# Patient Record
Sex: Female | Born: 1960 | Race: White | Hispanic: No | Marital: Married | Smoking: Never smoker
Health system: Southern US, Community
[De-identification: ages and names within clinical notes are randomized; demographics above are authoritative.]

## PROBLEM LIST (undated history)

## (undated) DIAGNOSIS — E7800 Pure hypercholesterolemia, unspecified: Secondary | ICD-10-CM

## (undated) DIAGNOSIS — K219 Gastro-esophageal reflux disease without esophagitis: Secondary | ICD-10-CM

## (undated) HISTORY — DX: Gastro-esophageal reflux disease without esophagitis: K21.9

## (undated) HISTORY — DX: Pure hypercholesterolemia, unspecified: E78.00

---

## 1997-12-27 ENCOUNTER — Ambulatory Visit (HOSPITAL_COMMUNITY): Admission: RE | Admit: 1997-12-27 | Discharge: 1997-12-27 | Payer: Self-pay | Admitting: Obstetrics and Gynecology

## 1998-06-10 ENCOUNTER — Inpatient Hospital Stay (HOSPITAL_COMMUNITY): Admission: AD | Admit: 1998-06-10 | Discharge: 1998-06-10 | Payer: Self-pay | Admitting: Obstetrics and Gynecology

## 1999-04-23 ENCOUNTER — Other Ambulatory Visit: Admission: RE | Admit: 1999-04-23 | Discharge: 1999-04-23 | Payer: Self-pay | Admitting: Obstetrics and Gynecology

## 1999-06-06 ENCOUNTER — Encounter: Payer: Self-pay | Admitting: Obstetrics and Gynecology

## 1999-06-06 ENCOUNTER — Ambulatory Visit (HOSPITAL_COMMUNITY): Admission: RE | Admit: 1999-06-06 | Discharge: 1999-06-06 | Payer: Self-pay | Admitting: Obstetrics and Gynecology

## 2000-09-30 ENCOUNTER — Other Ambulatory Visit: Admission: RE | Admit: 2000-09-30 | Discharge: 2000-09-30 | Payer: Self-pay | Admitting: Obstetrics and Gynecology

## 2000-12-03 ENCOUNTER — Ambulatory Visit (HOSPITAL_COMMUNITY): Admission: RE | Admit: 2000-12-03 | Discharge: 2000-12-03 | Payer: Self-pay | Admitting: Obstetrics and Gynecology

## 2000-12-03 ENCOUNTER — Encounter: Payer: Self-pay | Admitting: Obstetrics and Gynecology

## 2001-12-04 ENCOUNTER — Encounter: Payer: Self-pay | Admitting: Obstetrics and Gynecology

## 2001-12-04 ENCOUNTER — Ambulatory Visit (HOSPITAL_COMMUNITY): Admission: RE | Admit: 2001-12-04 | Discharge: 2001-12-04 | Payer: Self-pay | Admitting: Obstetrics and Gynecology

## 2002-09-22 ENCOUNTER — Other Ambulatory Visit: Admission: RE | Admit: 2002-09-22 | Discharge: 2002-09-22 | Payer: Self-pay | Admitting: Obstetrics and Gynecology

## 2003-01-20 ENCOUNTER — Ambulatory Visit (HOSPITAL_COMMUNITY): Admission: RE | Admit: 2003-01-20 | Discharge: 2003-01-20 | Payer: Self-pay | Admitting: Obstetrics and Gynecology

## 2003-11-30 ENCOUNTER — Other Ambulatory Visit: Admission: RE | Admit: 2003-11-30 | Discharge: 2003-11-30 | Payer: Self-pay | Admitting: Obstetrics and Gynecology

## 2004-02-14 ENCOUNTER — Ambulatory Visit (HOSPITAL_COMMUNITY): Admission: RE | Admit: 2004-02-14 | Discharge: 2004-02-14 | Payer: Self-pay | Admitting: Obstetrics and Gynecology

## 2005-02-27 ENCOUNTER — Ambulatory Visit (HOSPITAL_COMMUNITY): Admission: RE | Admit: 2005-02-27 | Discharge: 2005-02-27 | Payer: Self-pay | Admitting: Obstetrics and Gynecology

## 2005-04-30 ENCOUNTER — Other Ambulatory Visit: Admission: RE | Admit: 2005-04-30 | Discharge: 2005-04-30 | Payer: Self-pay | Admitting: Obstetrics & Gynecology

## 2005-06-05 ENCOUNTER — Encounter: Payer: Self-pay | Admitting: Obstetrics and Gynecology

## 2005-07-02 ENCOUNTER — Encounter: Admission: RE | Admit: 2005-07-02 | Discharge: 2005-07-02 | Payer: Self-pay | Admitting: Internal Medicine

## 2006-05-05 ENCOUNTER — Ambulatory Visit (HOSPITAL_COMMUNITY): Admission: RE | Admit: 2006-05-05 | Discharge: 2006-05-05 | Payer: Self-pay | Admitting: Obstetrics and Gynecology

## 2006-05-14 ENCOUNTER — Encounter: Admission: RE | Admit: 2006-05-14 | Discharge: 2006-05-14 | Payer: Self-pay | Admitting: Obstetrics and Gynecology

## 2006-05-19 ENCOUNTER — Other Ambulatory Visit: Admission: RE | Admit: 2006-05-19 | Discharge: 2006-05-19 | Payer: Self-pay | Admitting: Obstetrics and Gynecology

## 2007-05-29 ENCOUNTER — Ambulatory Visit (HOSPITAL_COMMUNITY): Admission: RE | Admit: 2007-05-29 | Discharge: 2007-05-29 | Payer: Self-pay | Admitting: Obstetrics and Gynecology

## 2007-08-19 ENCOUNTER — Other Ambulatory Visit: Admission: RE | Admit: 2007-08-19 | Discharge: 2007-08-19 | Payer: Self-pay | Admitting: Obstetrics and Gynecology

## 2008-06-14 ENCOUNTER — Ambulatory Visit (HOSPITAL_COMMUNITY): Admission: RE | Admit: 2008-06-14 | Discharge: 2008-06-14 | Payer: Self-pay | Admitting: Obstetrics and Gynecology

## 2009-07-13 ENCOUNTER — Ambulatory Visit (HOSPITAL_COMMUNITY): Admission: RE | Admit: 2009-07-13 | Discharge: 2009-07-13 | Payer: Self-pay | Admitting: Obstetrics and Gynecology

## 2010-07-04 ENCOUNTER — Other Ambulatory Visit: Payer: Self-pay | Admitting: Obstetrics and Gynecology

## 2010-07-04 DIAGNOSIS — Z1231 Encounter for screening mammogram for malignant neoplasm of breast: Secondary | ICD-10-CM

## 2010-07-17 ENCOUNTER — Ambulatory Visit (HOSPITAL_COMMUNITY)
Admission: RE | Admit: 2010-07-17 | Discharge: 2010-07-17 | Disposition: A | Discharge status: Home / Self Care | Payer: Managed Care, Other (non HMO) | Source: Ambulatory Visit | Attending: Obstetrics and Gynecology | Admitting: Obstetrics and Gynecology

## 2010-07-17 DIAGNOSIS — Z1231 Encounter for screening mammogram for malignant neoplasm of breast: Secondary | ICD-10-CM | POA: Insufficient documentation

## 2011-07-18 ENCOUNTER — Other Ambulatory Visit: Payer: Self-pay | Admitting: Obstetrics and Gynecology

## 2011-07-18 DIAGNOSIS — Z1231 Encounter for screening mammogram for malignant neoplasm of breast: Secondary | ICD-10-CM

## 2011-08-09 ENCOUNTER — Ambulatory Visit (HOSPITAL_COMMUNITY)
Admission: RE | Admit: 2011-08-09 | Discharge: 2011-08-09 | Disposition: A | Discharge status: Home / Self Care | Payer: Managed Care, Other (non HMO) | Source: Ambulatory Visit | Attending: Obstetrics and Gynecology | Admitting: Obstetrics and Gynecology

## 2011-08-09 DIAGNOSIS — Z1231 Encounter for screening mammogram for malignant neoplasm of breast: Secondary | ICD-10-CM | POA: Insufficient documentation

## 2012-08-12 ENCOUNTER — Other Ambulatory Visit: Payer: Self-pay | Admitting: Obstetrics and Gynecology

## 2012-08-12 DIAGNOSIS — Z1231 Encounter for screening mammogram for malignant neoplasm of breast: Secondary | ICD-10-CM

## 2012-08-24 ENCOUNTER — Ambulatory Visit (HOSPITAL_COMMUNITY)
Admission: RE | Admit: 2012-08-24 | Discharge: 2012-08-24 | Disposition: A | Discharge status: Home / Self Care | Payer: Managed Care, Other (non HMO) | Source: Ambulatory Visit | Attending: Obstetrics and Gynecology | Admitting: Obstetrics and Gynecology

## 2012-08-24 DIAGNOSIS — Z1231 Encounter for screening mammogram for malignant neoplasm of breast: Secondary | ICD-10-CM | POA: Insufficient documentation

## 2013-06-22 ENCOUNTER — Encounter: Payer: Self-pay | Admitting: Certified Nurse Midwife

## 2013-07-22 ENCOUNTER — Ambulatory Visit: Payer: Self-pay | Admitting: Certified Nurse Midwife

## 2013-07-26 ENCOUNTER — Other Ambulatory Visit: Payer: Self-pay | Admitting: Certified Nurse Midwife

## 2013-07-26 DIAGNOSIS — Z1231 Encounter for screening mammogram for malignant neoplasm of breast: Secondary | ICD-10-CM

## 2013-08-03 ENCOUNTER — Ambulatory Visit: Payer: Self-pay | Admitting: Certified Nurse Midwife

## 2013-08-04 ENCOUNTER — Encounter: Payer: Self-pay | Admitting: Certified Nurse Midwife

## 2013-08-04 ENCOUNTER — Ambulatory Visit (INDEPENDENT_AMBULATORY_CARE_PROVIDER_SITE_OTHER): Payer: Managed Care, Other (non HMO) | Admitting: Certified Nurse Midwife

## 2013-08-04 VITALS — BP 116/68 | HR 68 | Resp 16 | Ht 65.75 in | Wt 144.0 lb

## 2013-08-04 DIAGNOSIS — Z01419 Encounter for gynecological examination (general) (routine) without abnormal findings: Secondary | ICD-10-CM

## 2013-08-04 NOTE — Patient Instructions (Signed)

## 2013-08-04 NOTE — Progress Notes (Signed)
53 y.o. [id number] Married Caucasian Fe here for annual exam.  Menopausal no HRT. Occasional hot flashes over the past 6 months, now using Omega 3 with good results. No insomnia issues. Sees PCP periodic. Occasional vaginal dryness uses OTC products without problems. [place] ? Medication use for anxiety. Patient has never taken anything before and has flew before. No other health issues today.  Patient's last menstrual period was 03/07/2012.          Sexually active: Yes.    The current method of family planning is vasectomy.    Exercising: Yes.    walking Smoker:  no  Health Maintenance: Pap:  02-17-12 neg HPV HR neg MMG: 08-24-12 density category b, bi-rads category 1: neg Colonoscopy:  2011 f/u 5 yrs BMD:   none TDaP:  2009 Labs: none Self breast exam: done occ   reports that she has never smoked. She does not have any smokeless tobacco history on file. She reports that she drinks about 4.2 ounces of alcohol per week. She reports that she does not use illicit drugs.  History reviewed. No pertinent past medical history.  Past Surgical History  Procedure Laterality Date  . Cesarean section      No current outpatient prescriptions on file.   No current facility-administered medications for this visit.    Family History  Problem Relation Age of Onset  . Stroke Mother     mini  . Diabetes Brother   . Breast cancer Maternal Aunt   . Cancer Paternal Grandmother     malignant tumor on kidney    ROS:  Pertinent items are noted in HPI.  Otherwise, a comprehensive ROS was negative.  Exam:   BP 116/68  Pulse 68  Resp 16  Ht 5' 5.75" (1.67 m)  Wt 144 lb (65.318 kg)  BMI 23.42 kg/m2  LMP 03/07/2012 Height: 5' 5.75" (167 cm)  Ht Readings from Last 3 Encounters:  08/04/13 5' 5.75" (1.67 m)    General appearance: alert, cooperative and appears stated age Head: Normocephalic, without obvious abnormality, atraumatic Neck: no adenopathy, supple,  symmetrical, trachea midline and thyroid normal to inspection and palpation and non-palpable Lungs: clear to auscultation bilaterally Breasts: normal appearance, no masses or tenderness, No nipple retraction or dimpling, No nipple discharge or bleeding, No axillary or supraclavicular adenopathy Heart: regular rate and rhythm Abdomen: soft, non-tender; no masses,  no organomegaly Extremities: extremities normal, atraumatic, no cyanosis or edema Skin: Skin color, texture, turgor normal. No rashes or lesions Lymph nodes: Cervical, supraclavicular, and axillary nodes normal. No abnormal inguinal nodes palpated Neurologic: Grossly normal   Pelvic: External genitalia:  no lesions              Urethra:  normal appearing urethra with no masses, tenderness or lesions              Bartholin's and Skene's: normal                 Vagina: normal appearing vagina with normal color and discharge, no lesions              Cervix: normal, non tender              Pap taken: No. Bimanual Exam:  Uterus:  normal size, contour, position, consistency, mobility, non-tender and anteverted              Adnexa: normal adnexa and no mass, fullness, tenderness  Rectovaginal: Confirms               Anus:  normal sphincter tone, no lesions  A:  Well Woman with normal exam  Menopausal no HRT using OTC Omega 3 with good results  Flying anxiety  P:   Reviewed health and wellness pertinent to exam  Discussed importance of advising if vaginal bleeding. Discussed Rx medication, but since she has never taken before, may not feel Ok with results. Encouraged patient to try Dramamine OTC sublingal at home to see if she has good results with and can use for flying. If not will advise.  Pap smear not taken today  counseled on breast self exam, mammography screening, adequate intake of calcium and vitamin D, diet and exercise  return annually or prn  An After Visit Summary was printed and given to the  patient.

## 2013-08-09 NOTE — Progress Notes (Signed)
Reviewed personally.  M. Suzanne Jakeb Lamping, MD.  

## 2013-08-30 ENCOUNTER — Ambulatory Visit (HOSPITAL_COMMUNITY)
Admission: RE | Admit: 2013-08-30 | Discharge: 2013-08-30 | Disposition: A | Discharge status: Home / Self Care | Payer: Managed Care, Other (non HMO) | Source: Ambulatory Visit | Attending: Certified Nurse Midwife | Admitting: Certified Nurse Midwife

## 2013-08-30 DIAGNOSIS — Z1231 Encounter for screening mammogram for malignant neoplasm of breast: Secondary | ICD-10-CM | POA: Insufficient documentation

## 2013-12-06 ENCOUNTER — Encounter: Payer: Self-pay | Admitting: Certified Nurse Midwife

## 2014-08-10 ENCOUNTER — Other Ambulatory Visit: Payer: Self-pay | Admitting: Certified Nurse Midwife

## 2014-08-10 DIAGNOSIS — Z1231 Encounter for screening mammogram for malignant neoplasm of breast: Secondary | ICD-10-CM

## 2014-09-07 ENCOUNTER — Ambulatory Visit (HOSPITAL_COMMUNITY)
Admission: RE | Admit: 2014-09-07 | Discharge: 2014-09-07 | Disposition: A | Discharge status: Home / Self Care | Payer: Managed Care, Other (non HMO) | Source: Ambulatory Visit | Attending: Certified Nurse Midwife | Admitting: Certified Nurse Midwife

## 2014-09-07 DIAGNOSIS — Z1231 Encounter for screening mammogram for malignant neoplasm of breast: Secondary | ICD-10-CM | POA: Insufficient documentation

## 2014-09-08 ENCOUNTER — Other Ambulatory Visit: Payer: Self-pay | Admitting: Certified Nurse Midwife

## 2014-09-08 DIAGNOSIS — R928 Other abnormal and inconclusive findings on diagnostic imaging of breast: Secondary | ICD-10-CM

## 2014-09-13 ENCOUNTER — Ambulatory Visit
Admission: RE | Admit: 2014-09-13 | Discharge: 2014-09-13 | Disposition: A | Discharge status: Home / Self Care | Payer: Managed Care, Other (non HMO) | Source: Ambulatory Visit | Attending: Certified Nurse Midwife | Admitting: Certified Nurse Midwife

## 2014-09-13 DIAGNOSIS — R928 Other abnormal and inconclusive findings on diagnostic imaging of breast: Secondary | ICD-10-CM

## 2014-09-14 ENCOUNTER — Encounter: Payer: Self-pay | Admitting: Obstetrics and Gynecology

## 2014-09-14 ENCOUNTER — Ambulatory Visit (INDEPENDENT_AMBULATORY_CARE_PROVIDER_SITE_OTHER): Payer: Managed Care, Other (non HMO) | Admitting: Obstetrics and Gynecology

## 2014-09-14 VITALS — BP 110/78 | HR 64 | Resp 14 | Ht 65.75 in | Wt 148.0 lb

## 2014-09-14 DIAGNOSIS — Z01419 Encounter for gynecological examination (general) (routine) without abnormal findings: Secondary | ICD-10-CM

## 2014-09-14 NOTE — Progress Notes (Signed)
Patient ID: Paige Mann, female   DOB: [birth year], 54 y.o.   MRN: [id number] 54 y.o. [id number] MarriedCaucasianF here for annual exam.  Postmenopausal, no vaginal bleeding. Sexually active, no pain, uses a lubricant. She has had nosebleeds intermittently in the last several months. She will f/u with her primary  Patient's last menstrual period was 03/07/2012.          Sexually active: Yes.    The current method of family planning is post menopausal status.    Exercising: Yes.    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 She has never used smokeless tobacco. She reports that she drinks about 2.4 oz of alcohol per week. She reports that she does not use illicit drugs.  History reviewed. No pertinent past medical history.  Past Surgical History  Procedure Laterality Date  . Cesarean section      No current outpatient prescriptions on file.   No current facility-administered medications for this visit.    Family History  Problem Relation Age of Onset  . Stroke Mother     mini  . Hypertension Mother   . Diabetes Brother   . Breast cancer Maternal Aunt   . Hypertension Father   . Cancer Paternal Grandfather     kidney tumor   SH: daughter is 79, grandchildren are 4 and 1 month, live locally.   ROS:  Pertinent items are noted in HPI.  Otherwise, a comprehensive ROS was negative.  Exam:   BP 110/78 mmHg  Pulse 64  Resp 14  Ht 5' 5.75" (1.67 m)  Wt 148 lb (67.132 kg)  BMI 24.07 kg/m2  LMP 03/07/2012  Weight change: @ Height:   Height: 5' 5.75" (167 cm)  Ht Readings from Last 3 Encounters:  09/14/14 5'  5.75" (1.67 m)  08/04/13 5' 5.75" (1.67 m)    General appearance: alert, cooperative and appears stated age Head: Normocephalic, without obvious abnormality, atraumatic Neck: no adenopathy, supple, symmetrical, trachea midline and thyroid normal to inspection and palpation Lungs: clear to auscultation bilaterally Breasts: normal appearance, no masses or tenderness Heart: regular rate and rhythm Abdomen: soft, non-tender; bowel sounds normal; no masses,  no organomegaly Extremities: extremities normal, atraumatic, no cyanosis or edema Skin: Skin color, texture, turgor normal. No rashes or lesions Lymph nodes: Cervical, supraclavicular, and axillary nodes normal. No abnormal inguinal nodes palpated Neurologic: Grossly normal   Pelvic: External genitalia:  no lesions              Urethra:  normal appearing urethra with no masses, tenderness or lesions              Bartholins and Skenes: normal                 Vagina: atrophic vaginal mucosa              Cervix: no lesions              Pap taken: No. Bimanual Exam:  Uterus:  normal size, contour, position, consistency, mobility, non-tender and anteverted  Adnexa: normal adnexa and no mass, fullness, tenderness               Rectovaginal: Confirms               Anus:  normal sphincter tone, no lesions  Chaperone was present for exam.  A:  Well Woman with normal exam  P:   Pap not due  Discussed calcium and vit D  Discussed breast self exam  Mammogram just done  Colonoscopy due, she will set it up  Labs with her primary

## 2014-09-14 NOTE — Patient Instructions (Signed)

## 2015-02-28 ENCOUNTER — Other Ambulatory Visit: Payer: Self-pay | Admitting: Certified Nurse Midwife

## 2015-02-28 DIAGNOSIS — R921 Mammographic calcification found on diagnostic imaging of breast: Secondary | ICD-10-CM

## 2015-03-16 ENCOUNTER — Ambulatory Visit
Admission: RE | Admit: 2015-03-16 | Discharge: 2015-03-16 | Disposition: A | Discharge status: Home / Self Care | Payer: Managed Care, Other (non HMO) | Source: Ambulatory Visit | Attending: Certified Nurse Midwife | Admitting: Certified Nurse Midwife

## 2015-03-16 DIAGNOSIS — R921 Mammographic calcification found on diagnostic imaging of breast: Secondary | ICD-10-CM

## 2015-09-22 ENCOUNTER — Other Ambulatory Visit: Payer: Self-pay | Admitting: Certified Nurse Midwife

## 2015-09-22 DIAGNOSIS — R921 Mammographic calcification found on diagnostic imaging of breast: Secondary | ICD-10-CM

## 2015-09-28 ENCOUNTER — Ambulatory Visit
Admission: RE | Admit: 2015-09-28 | Discharge: 2015-09-28 | Disposition: A | Discharge status: Home / Self Care | Payer: Managed Care, Other (non HMO) | Source: Ambulatory Visit | Attending: Certified Nurse Midwife | Admitting: Certified Nurse Midwife

## 2015-09-28 DIAGNOSIS — R921 Mammographic calcification found on diagnostic imaging of breast: Secondary | ICD-10-CM

## 2016-02-15 ENCOUNTER — Encounter: Payer: Self-pay | Admitting: Obstetrics and Gynecology

## 2016-02-15 ENCOUNTER — Ambulatory Visit (INDEPENDENT_AMBULATORY_CARE_PROVIDER_SITE_OTHER): Payer: Managed Care, Other (non HMO) | Admitting: Obstetrics and Gynecology

## 2016-02-15 VITALS — BP 110/70 | HR 80 | Resp 15 | Ht 65.5 in | Wt 154.0 lb

## 2016-02-15 DIAGNOSIS — R040 Epistaxis: Secondary | ICD-10-CM | POA: Diagnosis not present

## 2016-02-15 DIAGNOSIS — N952 Postmenopausal atrophic vaginitis: Secondary | ICD-10-CM | POA: Diagnosis not present

## 2016-02-15 DIAGNOSIS — Z01419 Encounter for gynecological examination (general) (routine) without abnormal findings: Secondary | ICD-10-CM

## 2016-02-15 DIAGNOSIS — Z124 Encounter for screening for malignant neoplasm of cervix: Secondary | ICD-10-CM

## 2016-02-15 NOTE — Patient Instructions (Signed)

## 2016-02-15 NOTE — Progress Notes (Signed)
56 y.o. [id number] MarriedCaucasianF here for annual exam.   She c/o intermittent nose bleeds, primary MD is aware. It is year round and just on the left side. Several times a week, occurs most times when she blows her nose. Not a lot.  No vaginal bleeding. Sexually active, no pain.     Patient's last menstrual period was 03/07/2012.          Sexually active: Yes.    The current method of family planning is vasectomy.    Exercising: Yes.    running Smoker:  no  Health Maintenance: Pap:  02-17-12 WNL NEG HR HPV  History of abnormal Pap:  no MMG:  09-28-15 table probably benign calcifications within the upper-outer quadrant of the LEFT breast, perhaps slightly coarser compared to the earliest exam suggesting benignity. Recommend additional follow-up diagnostic mammogram in 12 months to ensure 2 year stability.   Colonoscopy: 10/11  Polyps, overdue for colonoscopy BMD:   Never TDaP:  08-05-07 Gardasil: N/A   reports that she has never smoked. She has never used smokeless tobacco. She reports that she drinks about 4.2 oz of alcohol per week . She reports that she does not use drugs. 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  Daughter and 2 grandchildren are local  No past medical history on file.  Past Surgical History:  Procedure Laterality Date  . CESAREAN SECTION      No current outpatient prescriptions on file.   No current facility-administered medications for this visit.     Family History  Problem Relation Age of Onset  . Stroke Mother     mini  . Hypertension Mother   . Other Mother     Wegners Granulomatosis   . Diabetes Brother   . Breast cancer Maternal Aunt   . Hypertension Father   . Cancer Paternal Grandfather     kidney tumor    Review of Systems  Constitutional: Negative.   HENT: Positive for nosebleeds.   Eyes: Negative.   Respiratory: Negative.   Cardiovascular: Negative.   Gastrointestinal: Negative.   Endocrine: Negative.   Genitourinary: Negative.         Loss of sexual interest   Musculoskeletal: Negative.   Skin: Negative.   Allergic/Immunologic: Negative.   Neurological: Negative.   Psychiatric/Behavioral: Negative.     Exam:   BP 110/70 (BP Location: Right Arm, Patient Position: Sitting, Cuff Size: Normal)   Pulse 80   Resp 15   Ht 5' 5.5" (1.664 m)   Wt 154 lb (69.9 kg)   LMP 03/07/2012   BMI 25.24 kg/m   Weight change: @WEIGHTCHANGE @ Height:   Height: 5' 5.5" (166.4 cm)  Ht Readings from Last 3 Encounters:  02/15/16 5' 5.5" (1.664 m)  09/14/14 5' 5.75" (1.67 m)  08/04/13 5' 5.75" (1.67 m)    General appearance: alert, cooperative and appears stated age Head: Normocephalic, without obvious abnormality, atraumatic Neck: no adenopathy, supple, symmetrical, trachea midline and thyroid normal to inspection and palpation Lungs: clear to auscultation bilaterally Cardiovascular: regular rate and rhythm Breasts: normal appearance, no masses or tenderness Heart: regular rate and rhythm Abdomen: soft, non-tender; bowel sounds normal; no masses,  no organomegaly Extremities: extremities normal, atraumatic, no cyanosis or edema Skin: Skin color, texture, turgor normal. No rashes or lesions Lymph nodes: Cervical, supraclavicular, and axillary nodes normal. No abnormal inguinal nodes palpated Neurologic: Grossly normal   Pelvic: External genitalia:  no lesions              Urethra:  normal appearing urethra with no masses, tenderness or lesions              Bartholins and Skenes: normal                 Vagina: very atrophic vagina, superiorly appears friable              Cervix: no lesions, stenotic, friable with pap               Bimanual Exam:  Uterus:  normal size, contour, position, consistency, mobility, non-tender and anteverted              Adnexa: no mass, fullness, tenderness               Rectovaginal: Confirms               Anus:  normal sphincter tone, no lesions  Chaperone was present for exam.  A:  Well Woman  with normal exam  Over 1 year h/o unilateral nose bleeds (left)  P:   Pap with hpv  Mammogram in 8/18  She will call to set up a colonoscopy  Discussed breast self exam  Discussed calcium and vit D intake  Labs and immunizations with primary MD  Will refer to ENT

## 2016-02-28 NOTE — Addendum Note (Signed)
Addended by: Tobi BastosJERTSON, Ayomikun Starling E on: 02/28/2016 05:01 PM   Modules accepted: Orders

## 2016-03-01 LAB — IPS PAP TEST WITH HPV

## 2016-03-05 ENCOUNTER — Telehealth: Payer: Self-pay | Admitting: Obstetrics and Gynecology

## 2016-03-05 NOTE — Telephone Encounter (Signed)
Patient called after 5:00 PM on 03/04/16 and left a voicemail for [place], requesting a return call in reference to a referral.  In Becky's absence today, I returned the call to paitent. Left message on voicemail advising patient to return call to our office and to ask for Suzy, as I will be glad to answer any questions she has in regards to her referral.

## 2016-03-05 NOTE — Telephone Encounter (Signed)
Patient returned call.  Patient states Kriste BasqueBecky provided the appointment information, in regards to a referral [facility]. Patient states she is unable to keep appointment, but has been trying to contact their office to reschedule and is unable to connect to a live person. Patient asked to verify the phone number for the provider. I provided patient the phone number for [facility] [phone]. Patient states she will try again to call and reschedule,  if she has any issues contacting their office today she will let us know.

## 2016-10-05 HISTORY — PX: BREAST BIOPSY: SHX20

## 2016-10-22 ENCOUNTER — Other Ambulatory Visit: Payer: Self-pay | Admitting: Certified Nurse Midwife

## 2016-10-22 DIAGNOSIS — R921 Mammographic calcification found on diagnostic imaging of breast: Secondary | ICD-10-CM

## 2016-10-30 ENCOUNTER — Other Ambulatory Visit: Payer: Self-pay | Admitting: Certified Nurse Midwife

## 2016-10-30 ENCOUNTER — Ambulatory Visit
Admission: RE | Admit: 2016-10-30 | Discharge: 2016-10-30 | Disposition: A | Discharge status: Home / Self Care | Payer: 59 | Source: Ambulatory Visit | Attending: Certified Nurse Midwife | Admitting: Certified Nurse Midwife

## 2016-10-30 DIAGNOSIS — R921 Mammographic calcification found on diagnostic imaging of breast: Secondary | ICD-10-CM

## 2016-10-31 ENCOUNTER — Ambulatory Visit
Admission: RE | Admit: 2016-10-31 | Discharge: 2016-10-31 | Disposition: A | Discharge status: Home / Self Care | Payer: 59 | Source: Ambulatory Visit | Attending: Certified Nurse Midwife | Admitting: Certified Nurse Midwife

## 2016-10-31 DIAGNOSIS — R921 Mammographic calcification found on diagnostic imaging of breast: Secondary | ICD-10-CM

## 2017-02-19 ENCOUNTER — Telehealth: Payer: Self-pay | Admitting: *Deleted

## 2017-02-19 ENCOUNTER — Encounter: Payer: Self-pay | Admitting: Obstetrics and Gynecology

## 2017-02-19 ENCOUNTER — Other Ambulatory Visit: Payer: Self-pay

## 2017-02-19 ENCOUNTER — Ambulatory Visit (INDEPENDENT_AMBULATORY_CARE_PROVIDER_SITE_OTHER): Payer: Managed Care, Other (non HMO) | Admitting: Obstetrics and Gynecology

## 2017-02-19 VITALS — BP 104/60 | HR 72 | Resp 14 | Ht 65.5 in | Wt 154.0 lb

## 2017-02-19 DIAGNOSIS — G479 Sleep disorder, unspecified: Secondary | ICD-10-CM | POA: Diagnosis not present

## 2017-02-19 DIAGNOSIS — Z01419 Encounter for gynecological examination (general) (routine) without abnormal findings: Secondary | ICD-10-CM | POA: Diagnosis not present

## 2017-02-19 DIAGNOSIS — Z23 Encounter for immunization: Secondary | ICD-10-CM

## 2017-02-19 NOTE — Progress Notes (Signed)
57 y.o. [id number] MarriedCaucasianF here for annual exam.  No vaginal bleeding. No dyspareunia.  Having trouble sleeping 75-90% of the time. She falls asleep okay, wakes up a couple of hours later. Not having vasomotor symptoms. Tosses and turns. Not really tired during the day. By the end of the week, she is really tired. She does get up to void some nights.  She takes melatonin, helps some.     Patient's last menstrual period was 03/07/2012.          Sexually active: Yes.    The current method of family planning is post menopausal status.    Exercising: No.  The patient does not participate in regular exercise at present. Smoker:  no  Health Maintenance: Pap:  02-15-16 WNL NEG HR HPV 02-17-12 WNL NEG HR HPV History of abnormal Pap:  no MMG:  11-01-16 clip placed left breast- return for routine screening MMG  Colonoscopy:  11/2009 polyps repeat in 10 yrs   BMD:   N/A TDaP:  08-05-07 Gardasil: N/A   reports that  has never smoked. she has never used smokeless tobacco. She reports that she drinks about 4.2 oz of alcohol per week. She reports that she does not use drugs. 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  Daughter (single) and 2 grand daughters (2.5 and 7) are local  History reviewed. No pertinent past medical history.  Past Surgical History:  Procedure Laterality Date  . CESAREAN SECTION      Current Outpatient Medications  Medication Sig Dispense Refill  . ALPRAZolam (XANAX) 0.5 MG tablet Take by mouth.    . Melatonin 1 MG TABS Take by mouth.     No current facility-administered medications for this visit.     Family History  Problem Relation Age of Onset  . Stroke Mother        mini  . Hypertension Mother   . Other Mother        Wegners Granulomatosis   . Diabetes Brother   . Breast cancer Maternal Aunt   . Hypertension Father   . Cancer Paternal Grandfather        kidney tumor    Review of Systems  Constitutional: Negative.   HENT: Negative.   Eyes: Negative.    Respiratory: Negative.   Cardiovascular: Negative.   Gastrointestinal: Negative.   Endocrine: Negative.   Genitourinary: Negative.        Loss of urine with sneeze/cough  Musculoskeletal: Negative.   Skin: Negative.   Allergic/Immunologic: Negative.   Neurological: Negative.   Psychiatric/Behavioral: Positive for sleep disturbance.    Exam:   BP 104/60 (BP Location: Right Arm, Patient Position: Sitting, Cuff Size: Normal)   Pulse 72   Resp 14   Ht 5' 5.5" (1.664 m)   Wt 154 lb (69.9 kg)   LMP 03/07/2012   BMI 25.24 kg/m   Weight change: @WEIGHTCHANGE @ Height:   Height: 5' 5.5" (166.4 cm)  Ht Readings from Last 3 Encounters:  02/19/17 5' 5.5" (1.664 m)  02/15/16 5' 5.5" (1.664 m)  09/14/14 5' 5.75" (1.67 m)    General appearance: alert, cooperative and appears stated age Head: Normocephalic, without obvious abnormality, atraumatic Neck: no adenopathy, supple, symmetrical, trachea midline and thyroid normal to inspection and palpation Lungs: clear to auscultation bilaterally Cardiovascular: regular rate and rhythm Breasts: normal appearance, no masses or tenderness Abdomen: soft, non-tender; non distended,  no masses,  no organomegaly Extremities: extremities normal, atraumatic, no cyanosis or edema Skin: Skin color, texture, turgor normal. No  rashes or lesions Lymph nodes: Cervical, supraclavicular, and axillary nodes normal. No abnormal inguinal nodes palpated Neurologic: Grossly normal   Pelvic: External genitalia:  no lesions              Urethra:  normal appearing urethra with no masses, tenderness or lesions              Bartholins and Skenes: normal                 Vagina: normal appearing vagina with mild atrophy, normal color and discharge, no lesions              Cervix: no lesions               Bimanual Exam:  Uterus:  normal size, contour, position, consistency, mobility, non-tender              Adnexa: no mass, fullness, tenderness                Rectovaginal: Confirms               Anus:  normal sphincter tone, no lesions  Chaperone was present for exam.  A:  Well Woman with normal exam  Sleep disturbance, discussed ways to help, including not having ETOH, exercising, can try OTC medication. If that isn't working she will f/u with her primary  P:   No pap this year  Mammogram UTD, had recent biopsy  Colonoscopy is UTD  Discussed breast self exam  Discussed calcium and vit D intake  TDAP today  Labs with primary MD

## 2017-02-19 NOTE — Telephone Encounter (Signed)
Paige Mann, Jamaris Biernat Evelyn, MD  Leda MinHamm, Dalessandro Baldyga N, RN  Can you please check with the [facility] center as to when this patient is due for her next mammogram and is it diagnostic.  Please let her and me know.

## 2017-02-19 NOTE — Telephone Encounter (Signed)
Call to patient, no answer, left detailed message, ok per current dpr. Advised cofirmed with [facility] next MMG will be screening MMG in September 2019. Return call to office with any additional questions.   Routing to provider for final review. Patient is agreeable to disposition. Will close encounter.

## 2017-02-19 NOTE — Patient Instructions (Addendum)
EXERCISE AND DIET:  We recommended that you start or continue a regular exercise program for good health. Regular exercise means any activity that makes your heart beat faster and makes you sweat.  We recommend exercising at least 30 minutes per day at least 3 days a week, preferably 4 or 5.  We also recommend a diet low in fat and sugar.  Inactivity, poor dietary choices and obesity can cause diabetes, heart attack, stroke, and kidney damage, among others.    ALCOHOL AND SMOKING:  Women should limit their alcohol intake to no more than 7 drinks/beers/glasses of wine (combined, not each!) per week. Moderation of alcohol intake to this level decreases your risk of breast cancer and liver damage. And of course, no recreational drugs are part of a healthy lifestyle.  And absolutely no smoking or even second hand smoke. Most people know smoking can cause heart and lung diseases, but did you know it also contributes to weakening of your bones? Aging of your skin?  Yellowing of your teeth and nails?  CALCIUM AND VITAMIN D:  Adequate intake of calcium and Vitamin D are recommended.  The recommendations for exact amounts of these supplements seem to change often, but generally speaking 600 mg of calcium (either carbonate or citrate) and 800 units of Vitamin D per day seems prudent. Certain women may benefit from higher intake of Vitamin D.  If you are among these women, your doctor will have told you during your visit.    PAP SMEARS:  Pap smears, to check for cervical cancer or precancers,  have traditionally been done yearly, although recent scientific advances have shown that most women can have pap smears less often.  However, every woman still should have a physical exam from her gynecologist every year. It will include a breast check, inspection of the vulva and vagina to check for abnormal growths or skin changes, a visual exam of the cervix, and then an exam to evaluate the size and shape of the uterus and  ovaries.  And after 58 years of age, a rectal exam is indicated to check for rectal cancers. We will also provide age appropriate advice regarding health maintenance, like when you should have certain vaccines, screening for sexually transmitted diseases, bone density testing, colonoscopy, mammograms, etc.   MAMMOGRAMS:  All women over 40 years old should have a yearly mammogram. Many facilities now offer a "3D" mammogram, which may cost around $50 extra out of pocket. If possible,  we recommend you accept the option to have the 3D mammogram performed.  It both reduces the number of women who will be called back for extra views which then turn out to be normal, and it is better than the routine mammogram at detecting truly abnormal areas.    COLONOSCOPY:  Colonoscopy to screen for colon cancer is recommended for all women at age 50.  We know, you hate the idea of the prep.  We agree, BUT, having colon cancer and not knowing it is worse!!  Colon cancer so often starts as a polyp that can be seen and removed at colonscopy, which can quite literally save your life!  And if your first colonoscopy is normal and you have no family history of colon cancer, most women don't have to have it again for 10 years.  Once every ten years, you can do something that may end up saving your life, right?  We will be happy to help you get it scheduled when you are ready.    Be sure to check your insurance coverage so you understand how much it will cost.  It may be covered as a preventative service at no cost, but you should check your particular policy.      Leg Cramps Leg cramps occur when a muscle or muscles tighten and you have no control over this tightening (involuntary muscle contraction). Muscle cramps can develop in any muscle, but the most common place is in the calf muscles of the leg. Those cramps can occur during exercise or when you are at rest. Leg cramps are painful, and they may last for a few seconds to a few  minutes. Cramps may return several times before they finally stop. Usually, leg cramps are not caused by a serious medical problem. In many cases, the cause is not known. Some common causes include:  Overexertion.  Overuse from repetitive motions, or doing the same thing over and over.  Remaining in a certain position for a long period of time.  Improper preparation, form, or technique while performing a sport or an activity.  Dehydration.  Injury.  Side effects of some medicines.  Abnormally low levels of the salts and ions in your blood (electrolytes), especially potassium and calcium. These levels could be low if you are taking water pills (diuretics) or if you are pregnant.  Follow these instructions at home: Watch your condition for any changes. Taking the following actions may help to lessen any discomfort that you are feeling:  Stay well-hydrated. Drink enough fluid to keep your urine clear or pale yellow.  Try massaging, stretching, and relaxing the affected muscle. Do this for several minutes at a time.  For tight or tense muscles, use a warm towel, heating pad, or hot shower water directed to the affected area.  If you are sore or have pain after a cramp, applying ice to the affected area may relieve discomfort. ? Put ice in a plastic bag. ? Place a towel between your skin and the bag. ? Leave the ice on for 20 minutes, 2-3 times per day.  Avoid strenuous exercise for several days if you have been having frequent leg cramps.  Make sure that your diet includes the essential minerals for your muscles to work normally.  Take medicines only as directed by your health care provider.  Contact a health care provider if:  Your leg cramps get more severe or more frequent, or they do not improve over time.  Your foot becomes cold, numb, or blue. This information is not intended to replace advice given to you by your health care provider. Make sure you discuss any questions  you have with your health care provider. Document Released: 02/29/2004 Document Revised: 06/29/2015 Document Reviewed: 12/29/2013 Elsevier Interactive Patient Education  2018 [employer]. Insomnia Insomnia is a sleep disorder that makes it difficult to fall asleep or to stay asleep. Insomnia can cause tiredness (fatigue), low energy, difficulty concentrating, mood swings, and poor performance at work or school. There are three different ways to classify insomnia:  Difficulty falling asleep.  Difficulty staying asleep.  Waking up too early in the morning.  Any type of insomnia can be long-term (chronic) or short-term (acute). Both are common. Short-term insomnia usually lasts for three months or less. Chronic insomnia occurs at least three times a week for longer than three months. What are the causes? Insomnia may be caused by another condition, situation, or substance, such as:  Anxiety.  Certain medicines.  Gastroesophageal reflux disease (GERD) or other gastrointestinal  conditions.  Asthma or other breathing conditions.  Restless legs syndrome, sleep apnea, or other sleep disorders.  Chronic pain.  Menopause. This may include hot flashes.  Stroke.  Abuse of alcohol, tobacco, or illegal drugs.  Depression.  Caffeine.  Neurological disorders, such as Alzheimer disease.  An overactive thyroid (hyperthyroidism).  The cause of insomnia may not be known. What increases the risk? Risk factors for insomnia include:  Gender. Women are more commonly affected than men.  Age. Insomnia is more common as you get older.  Stress. This may involve your professional or personal life.  Income. Insomnia is more common in people with lower income.  Lack of exercise.  Irregular work schedule or night shifts.  Traveling between different time zones.  What are the signs or symptoms? If you have insomnia, trouble falling asleep or trouble staying asleep is the main symptom.  This may lead to other symptoms, such as:  Feeling fatigued.  Feeling nervous about going to sleep.  Not feeling rested in the morning.  Having trouble concentrating.  Feeling irritable, anxious, or depressed.  How is this treated? Treatment for insomnia depends on the cause. If your insomnia is caused by an underlying condition, treatment will focus on addressing the condition. Treatment may also include:  Medicines to help you sleep.  Counseling or therapy.  Lifestyle adjustments.  Follow these instructions at home:  Take medicines only as directed by your health care provider.  Keep regular sleeping and waking hours. Avoid naps.  Keep a sleep diary to help you and your health care provider figure out what could be causing your insomnia. Include: ? When you sleep. ? When you wake up during the night. ? How well you sleep. ? How rested you feel the next day. ? Any side effects of medicines you are taking. ? What you eat and drink.  Make your bedroom a comfortable place where it is easy to fall asleep: ? Put up shades or special blackout curtains to block light from outside. ? Use a white noise machine to block noise. ? Keep the temperature cool.  Exercise regularly as directed by your health care provider. Avoid exercising right before bedtime.  Use relaxation techniques to manage stress. Ask your health care provider to suggest some techniques that may work well for you. These may include: ? Breathing exercises. ? Routines to release muscle tension. ? Visualizing peaceful scenes.  Cut back on alcohol, caffeinated beverages, and cigarettes, especially close to bedtime. These can disrupt your sleep.  Do not overeat or eat spicy foods right before bedtime. This can lead to digestive discomfort that can make it hard for you to sleep.  Limit screen use before bedtime. This includes: ? Watching TV. ? Using your smartphone, tablet, and computer.  Stick to a routine.  This can help you fall asleep faster. Try to do a quiet activity, brush your teeth, and go to bed at the same time each night.  Get out of bed if you are still awake after 15 minutes of trying to sleep. Keep the lights down, but try reading or doing a quiet activity. When you feel sleepy, go back to bed.  Make sure that you drive carefully. Avoid driving if you feel very sleepy.  Keep all follow-up appointments as directed by your health care provider. This is important. Contact a health care provider if:  You are tired throughout the day or have trouble in your daily routine due to sleepiness.  You continue to  have sleep problems or your sleep problems get worse. Get help right away if:  You have serious thoughts about hurting yourself or someone else. This information is not intended to replace advice given to you by your health care provider. Make sure you discuss any questions you have with your health care provider. Document Released: 01/19/2000 Document Revised: 06/23/2015 Document Reviewed: 10/22/2013 Elsevier Interactive Patient Education  [employer].

## 2017-02-19 NOTE — Telephone Encounter (Signed)
Spoke with Rene KocherLynne Bailey, RN -Nurse Navigator at [facility]. Reviewed 10/31/16 breast biopsy report and recommendations, patient will follow with annual screening MMG in September 2019.

## 2017-06-04 DIAGNOSIS — E559 Vitamin D deficiency, unspecified: Secondary | ICD-10-CM | POA: Insufficient documentation

## 2017-06-04 DIAGNOSIS — F418 Other specified anxiety disorders: Secondary | ICD-10-CM | POA: Insufficient documentation

## 2017-06-04 DIAGNOSIS — I10 Essential (primary) hypertension: Secondary | ICD-10-CM | POA: Insufficient documentation

## 2017-06-04 DIAGNOSIS — F5102 Adjustment insomnia: Secondary | ICD-10-CM | POA: Insufficient documentation

## 2017-12-10 ENCOUNTER — Other Ambulatory Visit: Payer: Self-pay | Admitting: Certified Nurse Midwife

## 2017-12-10 DIAGNOSIS — Z1231 Encounter for screening mammogram for malignant neoplasm of breast: Secondary | ICD-10-CM

## 2018-01-21 ENCOUNTER — Ambulatory Visit
Admission: RE | Admit: 2018-01-21 | Discharge: 2018-01-21 | Disposition: A | Discharge status: Home / Self Care | Payer: 59 | Source: Ambulatory Visit | Attending: Certified Nurse Midwife | Admitting: Certified Nurse Midwife

## 2018-01-21 DIAGNOSIS — Z1231 Encounter for screening mammogram for malignant neoplasm of breast: Secondary | ICD-10-CM

## 2018-03-12 ENCOUNTER — Ambulatory Visit (INDEPENDENT_AMBULATORY_CARE_PROVIDER_SITE_OTHER): Payer: 59 | Admitting: Obstetrics and Gynecology

## 2018-03-12 ENCOUNTER — Encounter: Payer: Self-pay | Admitting: Obstetrics and Gynecology

## 2018-03-12 ENCOUNTER — Other Ambulatory Visit: Payer: Self-pay

## 2018-03-12 VITALS — BP 118/82 | HR 76 | Ht 65.75 in | Wt 144.0 lb

## 2018-03-12 DIAGNOSIS — Z01419 Encounter for gynecological examination (general) (routine) without abnormal findings: Secondary | ICD-10-CM | POA: Diagnosis not present

## 2018-03-12 NOTE — Patient Instructions (Signed)

## 2018-03-12 NOTE — Progress Notes (Signed)
58 y.o. [id number] Married White or Caucasian Not Hispanic or Latino female here for annual exam.   No c/o. No bleeding. No bowel or bladder c/o.  Husband had cataract surgery, got infected, currently without vision, seeing MD working on restoring vision. Husband also had afib, had a cardiac ablation. Not currently sexually active, too much going on.  They help there daughter (single) with her 2 young children. They are very busy. In the last year her BP was a little high she went on HCTZ, stopped it herself because her BP's were normal at home. She hasn't checked it recently.     Patient's last menstrual period was 03/07/2012.          Sexually active: Yes.    The current method of family planning is vasectomy.    Exercising: No.  The patient does not participate in regular exercise at present. Smoker:  No  Health Maintenance: Pap:  02-15-16 WNL NEG HR HPV 02-17-12 WNL NEG HR HPV History of abnormal Pap:  no MMG:  01/21/2018 Birads 1 negative Colonoscopy:  11/2009 polyps repeat in 10 yrs   BMD:   Never TDaP: 02/19/2017 Gardasil: N/A   reports that she has never smoked. She has never used smokeless tobacco. She reports current alcohol use of about 7.0 standard drinks of alcohol per week. She reports that she does not use drugs. 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  Daughter (single) and 2 grand daughters (3 and 8) are local. Celebrates her 19 th wedding anniversary this year, husband is a great guy.   History reviewed. No pertinent past medical history.  Past Surgical History:  Procedure Laterality Date  . BREAST BIOPSY Bilateral 10/2016  . CESAREAN SECTION      Current Outpatient Medications  Medication Sig Dispense Refill  . VITAMIN D PO Take by mouth.     No current facility-administered medications for this visit.     Family History  Problem Relation Age of Onset  . Stroke Mother        mini  . Hypertension Mother   . Other Mother        Wegners Granulomatosis   . Diabetes  Brother   . Breast cancer Maternal Aunt   . Hypertension Father   . Cancer Paternal Grandfather        kidney tumor    Review of Systems  Constitutional: Negative.   HENT: Negative.   Eyes: Negative.   Respiratory: Negative.   Cardiovascular: Negative.   Gastrointestinal: Negative.   Endocrine: Negative.   Genitourinary: Negative.   Musculoskeletal: Negative.   Skin: Negative.   Allergic/Immunologic: Negative.   Neurological: Negative.   Hematological: Negative.   Psychiatric/Behavioral: Negative.     Exam:   BP 140/82 (BP Location: Right Arm, Patient Position: Sitting, Cuff Size: Normal)   Pulse 76   Ht 5' 5.75" (1.67 m)   Wt 144 lb (65.3 kg)   LMP 03/07/2012   BMI 23.42 kg/m   Weight change: @WEIGHTCHANGE @ Height:   Height: 5' 5.75" (167 cm)  Ht Readings from Last 3 Encounters:  03/12/18 5' 5.75" (1.67 m)  02/19/17 5' 5.5" (1.664 m)  02/15/16 5' 5.5" (1.664 m)    General appearance: alert, cooperative and appears stated age Head: Normocephalic, without obvious abnormality, atraumatic Neck: no adenopathy, supple, symmetrical, trachea midline and thyroid normal to inspection and palpation Lungs: clear to auscultation bilaterally Cardiovascular: regular rate and rhythm Breasts: normal appearance, no masses or tenderness Abdomen: soft, non-tender; non distended,  no  masses,  no organomegaly Extremities: extremities normal, atraumatic, no cyanosis or edema Skin: Skin color, texture, turgor normal. No rashes or lesions Lymph nodes: Cervical, supraclavicular, and axillary nodes normal. No abnormal inguinal nodes palpated Neurologic: Grossly normal   Pelvic: External genitalia:  no lesions              Urethra:  normal appearing urethra with no masses, tenderness or lesions              Bartholins and Skenes: normal                 Vagina:atrophic appearing vagina with normal color and discharge, no lesions              Cervix: no lesions               Bimanual  Exam:  Uterus:  normal size, contour, position, consistency, mobility, non-tender              Adnexa: no mass, fullness, tenderness               Rectovaginal: Confirms               Anus:  normal sphincter tone, no lesions  Chaperone was present for exam.  A:  Well Woman with normal exam  P:   Pap next year  Labs with primary  Mammogram UTD  Colonoscopy next year  Discussed breast self exam  Discussed calcium and vit D intake

## 2018-12-23 ENCOUNTER — Other Ambulatory Visit: Payer: Self-pay

## 2018-12-23 DIAGNOSIS — Z20822 Contact with and (suspected) exposure to covid-19: Secondary | ICD-10-CM

## 2018-12-25 LAB — NOVEL CORONAVIRUS, NAA: SARS-CoV-2, NAA: NOT DETECTED

## 2019-01-11 ENCOUNTER — Telehealth: Payer: Self-pay | Admitting: Obstetrics and Gynecology

## 2019-01-11 ENCOUNTER — Other Ambulatory Visit: Payer: Self-pay | Admitting: Obstetrics and Gynecology

## 2019-01-11 DIAGNOSIS — Z1231 Encounter for screening mammogram for malignant neoplasm of breast: Secondary | ICD-10-CM

## 2019-01-11 NOTE — Telephone Encounter (Signed)
Please have her come in for a breast check, then we can decide which mammogram she needs.

## 2019-01-11 NOTE — Telephone Encounter (Signed)
Spoke with pt. Pt called [facility] to schedule MMG for yearly. Pt answered questions and has current  left breast tenderness x 1.5 weeks. Has happened in the past like 4 months ago, but has on and off for years. Wants to know if needs diagnostic ultrasound per [facility] or be seen first for OV? Next AEX 03/31/19 and last MMG was 01/21/2018.  Will route to Dr Talbert Nan for recommendations.

## 2019-01-11 NOTE — Telephone Encounter (Signed)
Patient called to schedule mammogram and was asked if she was having any issues. Patient stated that she is experiencing tenderness in her left breast. Patient told to call our office to have orders put in for diagnostic ultrasound.

## 2019-01-11 NOTE — Telephone Encounter (Signed)
Spoke back to pt. Pt agreeable to OV for breast tenderness. Pt scheduled 01/14/19 at 9am. Pt verbalized understanding.   Will route to Dr Talbert Nan for final review and will close encounter.

## 2019-01-12 NOTE — Progress Notes (Signed)
GYNECOLOGY  VISIT   HPI: 58 y.o.   Married White or Caucasian Not Hispanic or Latino  female   [phone] with Patient's last menstrual period was 03/07/2012.   here for left breast tenderness.  She has had intermittent left breast tenderness for years. Mostly on the lateral portion of her breast. This discomfort started ~1.5 weeks ago, very sensitive to the touch, the pain has almost resolved. No dietary changes. She is on supplements and Prilosec. She feels her bra's are well fitted.   GYNECOLOGIC HISTORY: Patient's last menstrual period was 03/07/2012. Contraception: Postmenopausal Menopausal hormone therapy: None        OB History    Gravida  3   Para  1   Term  1   Preterm      AB  2   Living  1     SAB  1   TAB  1   Ectopic      Multiple      Live Births  1              There are no problems to display for this patient.   History reviewed. No pertinent past medical history.  Past Surgical History:  Procedure Laterality Date  . BREAST BIOPSY Bilateral 10/2016  . CESAREAN SECTION      Current Outpatient Medications  Medication Sig Dispense Refill  . omeprazole (PRILOSEC) 20 MG capsule Take 1 tablet by mouth daily.    Marland Kitchen VITAMIN D PO Take by mouth.    . Zinc 100 MG TABS Take 1 tablet by mouth daily.     No current facility-administered medications for this visit.     ALLERGIES: Patient has no known allergies.  Family History  Problem Relation Age of Onset  . Stroke Mother        mini  . Hypertension Mother   . Other Mother        Wegners Granulomatosis   . Diabetes Brother   . Breast cancer Maternal Aunt   . Hypertension Father   . Cancer Paternal Grandfather        kidney tumor    Social History   Socioeconomic History  . Marital status: Married    Spouse name: Not on file  . Number of children: Not on file  . Years of education: Not on file  . Highest education level: Not on file  Occupational History  . Not on file  Tobacco Use   . Smoking status: Never Smoker  . Smokeless tobacco: Never Used  Substance and Sexual Activity  . Alcohol use: Yes    Alcohol/week: 7.0 standard drinks    Types: 7 Glasses of wine per week  . Drug use: No  . Sexual activity: Yes    Partners: Male    Birth control/protection: Post-menopausal    Comment: husband vasectomy  Other Topics Concern  . Not on file  Social History Narrative  . Not on file   Social Determinants of Health   Financial Resource Strain:   . Difficulty of Paying Living Expenses: Not on file  Food Insecurity:   . Worried About Charity fundraiser in the Last Year: Not on file  . Ran Out of Food in the Last Year: Not on file  Transportation Needs:   . Lack of Transportation (Medical): Not on file  . Lack of Transportation (Non-Medical): Not on file  Physical Activity:   . Days of Exercise per Week: Not on file  . Minutes  of Exercise per Session: Not on file  Stress:   . Feeling of Stress : Not on file  Social Connections:   . Frequency of Communication with Friends and Family: Not on file  . Frequency of Social Gatherings with Friends and Family: Not on file  . Attends Religious Services: Not on file  . Active Member of Clubs or Organizations: Not on file  . Attends Banker Meetings: Not on file  . Marital Status: Not on file  Intimate Partner Violence:   . Fear of Current or Ex-Partner: Not on file  . Emotionally Abused: Not on file  . Physically Abused: Not on file  . Sexually Abused: Not on file    Review of Systems  Constitutional:       Breast tenderness  HENT: Negative.   Eyes: Negative.   Respiratory: Negative.   Cardiovascular: Negative.   Gastrointestinal: Negative.   Genitourinary: Negative.   Musculoskeletal: Negative.   Skin: Negative.   Neurological: Negative.   Endo/Heme/Allergies: Negative.   Psychiatric/Behavioral: Negative.     PHYSICAL EXAMINATION:    BP 118/80 (BP Location: Right Arm, Patient Position:  Sitting, Cuff Size: Normal)   Pulse 76   Temp (!) 97.1 F (36.2 C) (Skin)   Wt 156 lb 9.6 oz (71 kg)   LMP 03/07/2012   BMI 25.47 kg/m     General appearance: alert, cooperative and appears stated age Breasts: normal appearance, no masses, tender in the lateral portion of the left breast. Lymph: no supraclavicular or axillary adenopathy  ASSESSMENT Left breast pain/tenderness    PLAN Diagnostic breast imaging Given information on breast pain, ice, NSAID's Discussed caffeine use    An After Visit Summary was printed and given to the patient.

## 2019-01-13 ENCOUNTER — Other Ambulatory Visit: Payer: Self-pay

## 2019-01-14 ENCOUNTER — Ambulatory Visit: Payer: Self-pay | Admitting: Obstetrics and Gynecology

## 2019-01-15 ENCOUNTER — Telehealth: Payer: Self-pay | Admitting: *Deleted

## 2019-01-15 ENCOUNTER — Encounter: Payer: Self-pay | Admitting: Obstetrics and Gynecology

## 2019-01-15 ENCOUNTER — Ambulatory Visit (INDEPENDENT_AMBULATORY_CARE_PROVIDER_SITE_OTHER): Payer: 59 | Admitting: Obstetrics and Gynecology

## 2019-01-15 ENCOUNTER — Other Ambulatory Visit: Payer: Self-pay

## 2019-01-15 VITALS — BP 118/80 | HR 76 | Temp 97.1°F | Wt 156.6 lb

## 2019-01-15 DIAGNOSIS — N644 Mastodynia: Secondary | ICD-10-CM

## 2019-01-15 NOTE — Patient Instructions (Addendum)

## 2019-01-15 NOTE — Telephone Encounter (Signed)
-----   Message from Salvadore Dom, MD sent at 01/15/2019  8:40 AM EST ----- Please set her up for diagnostic breast imaging, attention left lateral breast. Diagnosis mastalgia.  Thanks,Hernando Reali

## 2019-01-15 NOTE — Telephone Encounter (Signed)
Spoke with Benjamine Mola at [facility]. Patient is scheduled for bilateral Dx MMG and left breast US, if needed, on 01/26/19 at 2:20pm,arrive at 2pm.   Call placed to patient to notify, patient is agreeable to date and time.   Placed in Ruch hold.   Routing to provider for final review. Patient is agreeable to disposition. Will close encounter.

## 2019-01-26 ENCOUNTER — Ambulatory Visit
Admission: RE | Admit: 2019-01-26 | Discharge: 2019-01-26 | Disposition: A | Discharge status: Home / Self Care | Payer: Managed Care, Other (non HMO) | Source: Ambulatory Visit | Attending: Obstetrics and Gynecology | Admitting: Obstetrics and Gynecology

## 2019-01-26 ENCOUNTER — Ambulatory Visit: Payer: 59

## 2019-01-26 ENCOUNTER — Other Ambulatory Visit: Payer: Self-pay

## 2019-01-26 DIAGNOSIS — N644 Mastodynia: Secondary | ICD-10-CM

## 2019-02-08 ENCOUNTER — Ambulatory Visit: Payer: Self-pay | Admitting: *Deleted

## 2019-02-08 NOTE — Telephone Encounter (Signed)
Message from Tonita Phoenix sent at 02/08/2019 11:42 AM EST  Summary: Possible Covid-19 exposure   Pt stated her grandkid's father displayed Covid symptoms and has gotten tested for Covid however his results have not come back yet. They would like to know if they need to get tested or wait. Please advise.          Pt with someone 12/24 that is positive for COVID, keeping grandkids now for the past weej that were exposed to that positive person everyday the prior week. Pt instructed in appt procedure for testing. Educated on quarantine, isolation. Pt denies sx in her, her husband, or either of the children.  Reason for Disposition . [1] CLOSE CONTACT COVID-19 EXPOSURE within last 14 days AND [2] NO symptoms  Answer Assessment - Initial Assessment Questions 1. COVID-19 CLOSE CONTACT: "Who is the person with the confirmed or suspected COVID-19 infection that you were exposed to?"     From 12/24 until now 2. PLACE of CONTACT: "Where were you when you were exposed to COVID-19?" (e.g., home, school, medical waiting room; which city?)     home 3. TYPE of CONTACT: "How much contact was there?" (e.g., sitting next to, live in same house, work in same office, same building)     In house continuously 4. DURATION of CONTACT: "How long were you in contact with the COVID-19 patient?" (e.g., a few seconds, passed by person, a few minutes, 15 minutes or longer, live with the patient)     All day 5. MASK: "Were you wearing a mask?" "Was the other person wearing a mask?" Note: wearing a mask reduces the risk of an  otherwise close contact.     no 6. DATE of CONTACT: "When did you have contact with a COVID-19 patient?" (e.g., how many days ago)     12/24  7. COMMUNITY SPREAD: "Are there lots of cases of COVID-19 (community spread) where you live?" (See public health department website, if unsure)       Not that know of 8. SYMPTOMS: "Do you have any symptoms?" (e.g., fever, cough, breathing difficulty, loss  of taste or smell)     no 9. PREGNANCY OR POSTPARTUM: "Is there any chance you are pregnant?" "When was your last menstrual period?" "Did you deliver in the last 2 weeks?"     no 10. HIGH RISK: "Do you have any heart or lung problems? Do you have a weak immune system?" (e.g., heart failure, COPD, asthma, HIV positive, chemotherapy, renal failure, diabetes mellitus, sickle cell anemia, obesity)       No, take care of mother who does  Protocols used: CORONAVIRUS (COVID-19) EXPOSURE-A-AH

## 2019-02-12 ENCOUNTER — Ambulatory Visit: Payer: Self-pay | Attending: Internal Medicine

## 2019-02-12 ENCOUNTER — Other Ambulatory Visit: Payer: 59

## 2019-02-12 DIAGNOSIS — Z20822 Contact with and (suspected) exposure to covid-19: Secondary | ICD-10-CM

## 2019-02-14 LAB — NOVEL CORONAVIRUS, NAA: SARS-CoV-2, NAA: NOT DETECTED

## 2019-03-31 ENCOUNTER — Ambulatory Visit: Payer: 59 | Admitting: Obstetrics and Gynecology

## 2019-04-26 ENCOUNTER — Encounter: Payer: Self-pay | Admitting: Certified Nurse Midwife

## 2019-05-12 ENCOUNTER — Ambulatory Visit: Payer: 59 | Admitting: Obstetrics and Gynecology

## 2019-06-29 ENCOUNTER — Other Ambulatory Visit: Payer: Self-pay

## 2019-06-29 NOTE — Progress Notes (Signed)
59 y.o. [id number] Married White or Caucasian Not Hispanic or Latino female here for annual exam.   No bleeding. No dyspareunia.     Patient's last menstrual period was 03/07/2012.          Sexually active: Yes.    The current method of family planning is post menopausal status.    Exercising: No.  The patient does not participate in regular exercise at present. Smoker:  no  Health Maintenance: Pap:  02-15-16 WNL NEG HR HPV 02-17-12 WNL NEG HR HPV History of abnormal Pap:  no MMG:  01/26/19 density b Bi-rads 1 neg  BMD:   Never  Colonoscopy: 11/2009 Polyps repeat 10 years  TDaP:  02/19/2017 Gardasil: NA   reports that she has never smoked. She has never used smokeless tobacco. She reports current alcohol use of about 7.0 standard drinks of alcohol per week. She reports that she does not use drugs. 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  Daughter(single)and 2 grand daughters (9 and 4) are local.  No past medical history on file.  Past Surgical History:  Procedure Laterality Date  . BREAST BIOPSY Bilateral 10/2016  . CESAREAN SECTION      Current Outpatient Medications  Medication Sig Dispense Refill  . omeprazole (PRILOSEC) 20 MG capsule Take 1 tablet by mouth daily.    Marland Kitchen VITAMIN D PO Take by mouth.    . Zinc 100 MG TABS Take 1 tablet by mouth daily.     No current facility-administered medications for this visit.    Family History  Problem Relation Age of Onset  . Stroke Mother        mini  . Hypertension Mother   . Other Mother        Wegners Granulomatosis   . Diabetes Brother   . Breast cancer Maternal Aunt   . Hypertension Father   . Cancer Paternal Grandfather        kidney tumor    Review of Systems  All other systems reviewed and are negative.   Exam:   LMP 03/07/2012   Weight change: @WEIGHTCHANGE @ Height:      Ht Readings from Last 3 Encounters:  03/12/18 5' 5.75" (1.67 m)  02/19/17 5' 5.5" (1.664 m)  02/15/16 5' 5.5" (1.664 m)    General appearance:  alert, cooperative and appears stated age Head: Normocephalic, without obvious abnormality, atraumatic Neck: no adenopathy, supple, symmetrical, trachea midline and thyroid normal to inspection and palpation Lungs: clear to auscultation bilaterally Cardiovascular: regular rate and rhythm Breasts: normal appearance, no masses or tenderness Abdomen: soft, non-tender; non distended,  no masses,  no organomegaly Extremities: extremities normal, atraumatic, no cyanosis or edema Skin: Skin color, texture, turgor normal. No rashes or lesions Lymph nodes: Cervical, supraclavicular, and axillary nodes normal. No abnormal inguinal nodes palpated Neurologic: Grossly normal   Pelvic: External genitalia:  no lesions              Urethra:  normal appearing urethra with no masses, tenderness or lesions              Bartholins and Skenes: normal                 Vagina: atrophic appearing vagina with normal color and discharge, no lesions              Cervix: no lesions               Bimanual Exam:  Uterus:  normal size, contour, position, consistency, mobility,  non-tender              Adnexa: no mass, fullness, tenderness               Rectovaginal: Confirms               Anus:  normal sphincter tone, no lesions  Gae Dry chaperoned for the exam.  A:  Well Woman with normal exam  P:   Colonoscopy in the fall  Mammogram is UTD  Discussed breast self exam  Discussed calcium and vit D intake  Labs with primary

## 2019-06-30 ENCOUNTER — Encounter: Payer: Self-pay | Admitting: Obstetrics and Gynecology

## 2019-06-30 ENCOUNTER — Ambulatory Visit (INDEPENDENT_AMBULATORY_CARE_PROVIDER_SITE_OTHER): Payer: 59 | Admitting: Obstetrics and Gynecology

## 2019-06-30 VITALS — BP 136/72 | HR 84 | Temp 98.1°F | Ht 65.5 in | Wt 158.0 lb

## 2019-06-30 DIAGNOSIS — Z01419 Encounter for gynecological examination (general) (routine) without abnormal findings: Secondary | ICD-10-CM | POA: Diagnosis not present

## 2019-06-30 NOTE — Patient Instructions (Signed)

## 2020-03-24 ENCOUNTER — Other Ambulatory Visit: Payer: Self-pay | Admitting: Obstetrics and Gynecology

## 2020-03-24 DIAGNOSIS — Z1231 Encounter for screening mammogram for malignant neoplasm of breast: Secondary | ICD-10-CM

## 2020-04-28 DIAGNOSIS — Z1231 Encounter for screening mammogram for malignant neoplasm of breast: Secondary | ICD-10-CM

## 2020-07-04 NOTE — Progress Notes (Deleted)
60 y.o. [id number] Married White or Caucasian Not Hispanic or Latino female here for annual exam.      Patient's last menstrual period was 03/07/2012.          Sexually active: {yes no:314532}  The current method of family planning is {contraception:315051}.    Exercising: {yes no:314532}  {types:19826} Smoker:  {YES NO:22349}  Health Maintenance: Pap: 02-15-16 WNL NEG HR HPV 02-17-12 WNL NEG HR HPV  History of abnormal Pap:  no MMG:  04/28/20 Bi-rads 2 benign  BMD:   Never  Colonoscopy:11/2009 Polyps repeat 10 years  *** TDaP:02/19/17  Gardasil: NA  reports that she has never smoked. She has never used smokeless tobacco. She reports current alcohol use of about 7.0 standard drinks of alcohol per week. She reports that she does not use drugs.  No past medical history on file.  Past Surgical History:  Procedure Laterality Date  . BREAST BIOPSY Bilateral 10/2016  . CESAREAN SECTION      Current Outpatient Medications  Medication Sig Dispense Refill  . dicyclomine (BENTYL) 10 MG capsule Take by mouth.    Marland Kitchen omeprazole (PRILOSEC) 20 MG capsule Take 1 tablet by mouth daily.    Marland Kitchen VITAMIN D PO Take by mouth.    . Zinc 100 MG TABS Take 1 tablet by mouth daily.     No current facility-administered medications for this visit.    Family History  Problem Relation Age of Onset  . Stroke Mother        mini  . Hypertension Mother   . Other Mother        Wegners Granulomatosis   . Diabetes Brother   . Breast cancer Maternal Aunt   . Hypertension Father   . Cancer Paternal Grandfather        kidney tumor    Review of Systems  Exam:   LMP 03/07/2012   Weight change: @WEIGHTCHANGE @ Height:      Ht Readings from Last 3 Encounters:  06/30/19 5' 5.5" (1.664 m)  03/12/18 5' 5.75" (1.67 m)  02/19/17 5' 5.5" (1.664 m)    General appearance: alert, cooperative and appears stated age Head: Normocephalic, without obvious abnormality, atraumatic Neck: no adenopathy, supple, symmetrical,  trachea midline and thyroid {CHL AMB PHY EX THYROID NORM DEFAULT:[phone]::"normal to inspection and palpation"} Lungs: clear to auscultation bilaterally Cardiovascular: regular rate and rhythm Breasts: {Exam; breast:13139::"normal appearance, no masses or tenderness"} Abdomen: soft, non-tender; non distended,  no masses,  no organomegaly Extremities: extremities normal, atraumatic, no cyanosis or edema Skin: Skin color, texture, turgor normal. No rashes or lesions Lymph nodes: Cervical, supraclavicular, and axillary nodes normal. No abnormal inguinal nodes palpated Neurologic: Grossly normal   Pelvic: External genitalia:  no lesions              Urethra:  normal appearing urethra with no masses, tenderness or lesions              Bartholins and Skenes: normal                 Vagina: normal appearing vagina with normal color and discharge, no lesions              Cervix: {CHL AMB PHY EX CERVIX NORM DEFAULT:[phone]::"no lesions"}               Bimanual Exam:  Uterus:  {CHL AMB PHY EX UTERUS NORM [phone]::"normal size, contour, position, consistency, mobility, non-tender"}  Adnexa: {CHL AMB PHY EX ADNEXA NO MASS DEFAULT:[phone]::"no mass, fullness, tenderness"}               Rectovaginal: Confirms               Anus:  normal sphincter tone, no lesions  *** chaperoned for the exam.  A:  Well Woman with normal exam  P:

## 2020-07-05 ENCOUNTER — Ambulatory Visit: Payer: 59 | Admitting: Obstetrics and Gynecology

## 2020-07-05 NOTE — Progress Notes (Deleted)
60 y.o. G3P1021 Married White or Caucasian Not Hispanic or Latino female here for annual exam.      Patient's last menstrual period was 03/07/2012.          Sexually active: {yes no:314532}  The current method of family planning is {contraception:315051}.    Exercising: {yes no:314532}  {types:19826} Smoker:  {YES NO:22349}  Health Maintenance: Pap: 02-15-16 WNL NEG HR HPV 02-17-12 WNL NEG HR HPV  History of abnormal Pap:  no MMG:  04/28/20 Bi-rads 2 benign  BMD:   Never  Colonoscopy:11/2009 Polyps repeat 10 years  *** TDaP:02/19/17  Gardasil: NA  reports that she has never smoked. She has never used smokeless tobacco. She reports current alcohol use of about 7.0 standard drinks of alcohol per week. She reports that she does not use drugs.  No past medical history on file.  Past Surgical History:  Procedure Laterality Date  . BREAST BIOPSY Bilateral 10/2016  . CESAREAN SECTION      Current Outpatient Medications  Medication Sig Dispense Refill  . dicyclomine (BENTYL) 10 MG capsule Take by mouth.    . omeprazole (PRILOSEC) 20 MG capsule Take 1 tablet by mouth daily.    . VITAMIN D PO Take by mouth.    . Zinc 100 MG TABS Take 1 tablet by mouth daily.     No current facility-administered medications for this visit.    Family History  Problem Relation Age of Onset  . Stroke Mother        mini  . Hypertension Mother   . Other Mother        Wegners Granulomatosis   . Diabetes Brother   . Breast cancer Maternal Aunt   . Hypertension Father   . Cancer Paternal Grandfather        kidney tumor    Review of Systems  Exam:   LMP 03/07/2012   Weight change: @WEIGHTCHANGE@ Height:      Ht Readings from Last 3 Encounters:  06/30/19 5' 5.5" (1.664 m)  03/12/18 5' 5.75" (1.67 m)  02/19/17 5' 5.5" (1.664 m)    General appearance: alert, cooperative and appears stated age Head: Normocephalic, without obvious abnormality, atraumatic Neck: no adenopathy, supple, symmetrical,  trachea midline and thyroid {CHL AMB PHY EX THYROID NORM DEFAULT:[id number]::"normal to inspection and palpation"} Lungs: clear to auscultation bilaterally Cardiovascular: regular rate and rhythm Breasts: {Exam; breast:13139::"normal appearance, no masses or tenderness"} Abdomen: soft, non-tender; non distended,  no masses,  no organomegaly Extremities: extremities normal, atraumatic, no cyanosis or edema Skin: Skin color, texture, turgor normal. No rashes or lesions Lymph nodes: Cervical, supraclavicular, and axillary nodes normal. No abnormal inguinal nodes palpated Neurologic: Grossly normal   Pelvic: External genitalia:  no lesions              Urethra:  normal appearing urethra with no masses, tenderness or lesions              Bartholins and Skenes: normal                 Vagina: normal appearing vagina with normal color and discharge, no lesions              Cervix: {CHL AMB PHY EX CERVIX NORM DEFAULT:[id number]::"no lesions"}               Bimanual Exam:  Uterus:  {CHL AMB PHY EX UTERUS NORM DEFAULT:[id number]::"normal size, contour, position, consistency, mobility, non-tender"}                Adnexa: {CHL AMB PHY EX ADNEXA NO MASS DEFAULT:[phone]::"no mass, fullness, tenderness"}               Rectovaginal: Confirms               Anus:  normal sphincter tone, no lesions  *** chaperoned for the exam.  A:  Well Woman with normal exam  P:

## 2020-07-14 ENCOUNTER — Ambulatory Visit: Payer: 59 | Admitting: Obstetrics and Gynecology

## 2020-11-14 ENCOUNTER — Ambulatory Visit: Payer: 59 | Admitting: Obstetrics and Gynecology

## 2020-12-25 NOTE — Progress Notes (Signed)
60 y.o. Y1O1751 Married White or Caucasian Not Hispanic or Latino female here for annual exam.  No vaginal bleeding. Not recently sexually active, no h/o dyspareunia.     Patient's last menstrual period was 03/07/2012.          Sexually active: Yes.    The current method of family planning is post menopausal status.    Exercising: Yes.     Smoker:  no  Health Maintenance: Pap:  02-15-16 Normal Neg HR HPV; 02-17-12 WNL NEG HR HPV History of abnormal Pap:  no MMG:  04-28-20 normal BMD:   Never Colonoscopy: 2021, f/u in 2031 TDaP: 2019 Gardasil: N/A   reports that she has never smoked. She has never used smokeless tobacco. She reports current alcohol use of about 7.0 standard drinks per week. She reports that she does not use drugs. 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  Daughter and 2 grand daughters (6 and 46) are living with her, will be moving out (next door).  Past Medical History:  Diagnosis Date   Acid reflux    Elevated cholesterol     Past Surgical History:  Procedure Laterality Date   BREAST BIOPSY Bilateral 10/2016   CESAREAN SECTION      Current Outpatient Medications  Medication Sig Dispense Refill   famotidine (PEPCID) 40 MG tablet SMARTSIG:1 Tablet(s) By Mouth Every Evening     melatonin 5 MG TABS Take by mouth.     Multiple Vitamin (MULTIVITAMIN) tablet Take 1 tablet by mouth daily.     Probiotic Product (PROBIOTIC PO) Take by mouth.     rosuvastatin (CRESTOR) 10 MG tablet Take by mouth.     No current facility-administered medications for this visit.    Family History  Problem Relation Age of Onset   Stroke Mother        mini   Hypertension Mother    Other Mother        Parks Neptune    Hypertension Father    Melanoma Father    Diabetes Brother    Breast cancer Maternal Aunt    Cancer Paternal Grandfather        kidney tumor    Review of Systems  All other systems reviewed and are  negative.  Exam:   BP 126/80 (BP Location: Right Arm, Patient Position: Sitting, Cuff Size: Normal)   Pulse 74   Ht 5\' 6"  (1.676 m)   Wt 159 lb (72.1 kg)   LMP 03/07/2012   SpO2 98%   BMI 25.66 kg/m   Weight change: @WEIGHTCHANGE @ Height:   Height: 5\' 6"  (167.6 cm)  Ht Readings from Last 3 Encounters:  01/05/21 5\' 6"  (1.676 m)  06/30/19 5' 5.5" (1.664 m)  03/12/18 5' 5.75" (1.67 m)    General appearance: alert, cooperative and appears stated age Head: Normocephalic, without obvious abnormality, atraumatic Neck: no adenopathy, supple, symmetrical, trachea midline and thyroid normal to inspection and palpation Lungs: clear to auscultation bilaterally Cardiovascular: regular rate and rhythm Breasts: normal appearance, no masses or tenderness Abdomen: soft, non-tender; non distended,  no masses,  no organomegaly Extremities: extremities normal, atraumatic, no cyanosis or edema Skin: Skin color, texture, turgor normal. No rashes or lesions Lymph nodes: Cervical, supraclavicular, and axillary nodes normal. No abnormal inguinal nodes palpated Neurologic: Grossly normal   Pelvic: External genitalia:  no lesions              Urethra:  normal appearing urethra with no  masses, tenderness or lesions              Bartholins and Skenes: normal                 Vagina: atrophic appearing vagina with normal color and discharge, no lesions              Cervix: no lesions               Bimanual Exam:  Uterus:  normal size, contour, position, consistency, mobility, non-tender              Adnexa: no mass, fullness, tenderness               Rectovaginal: Confirms               Anus:  normal sphincter tone, no lesions  Kennon Portela, CMA chaperoned for the exam.  1. Well woman exam Discussed breast self exam Discussed calcium and vit D intake Mammogram in 3/23 Colonoscopy UTD Labs with primary  2. Screening for cervical cancer - Cytology - PAP  Addendum: desires flu shot

## 2021-01-05 ENCOUNTER — Encounter: Payer: Self-pay | Admitting: Obstetrics and Gynecology

## 2021-01-05 ENCOUNTER — Other Ambulatory Visit (HOSPITAL_COMMUNITY)
Admission: RE | Admit: 2021-01-05 | Discharge: 2021-01-05 | Disposition: A | Discharge status: Home / Self Care | Payer: 59 | Source: Ambulatory Visit | Attending: Obstetrics and Gynecology | Admitting: Obstetrics and Gynecology

## 2021-01-05 ENCOUNTER — Ambulatory Visit (INDEPENDENT_AMBULATORY_CARE_PROVIDER_SITE_OTHER): Payer: BC Managed Care – PPO | Admitting: Obstetrics and Gynecology

## 2021-01-05 ENCOUNTER — Other Ambulatory Visit: Payer: Self-pay

## 2021-01-05 VITALS — BP 126/80 | HR 74 | Ht 66.0 in | Wt 159.0 lb

## 2021-01-05 DIAGNOSIS — Z01419 Encounter for gynecological examination (general) (routine) without abnormal findings: Secondary | ICD-10-CM | POA: Diagnosis not present

## 2021-01-05 DIAGNOSIS — Z23 Encounter for immunization: Secondary | ICD-10-CM

## 2021-01-05 DIAGNOSIS — Z124 Encounter for screening for malignant neoplasm of cervix: Secondary | ICD-10-CM | POA: Diagnosis not present

## 2021-01-05 NOTE — Patient Instructions (Signed)

## 2021-01-08 LAB — CYTOLOGY - PAP
Adequacy: ABSENT
Comment: NEGATIVE
Diagnosis: NEGATIVE
High risk HPV: NEGATIVE

## 2021-03-24 IMAGING — MG DIGITAL DIAGNOSTIC BILAT W/ TOMO W/ CAD
8 series · 9 of 24 positions shown · non-contrast
Comparison: Previous exam(s).

CLINICAL DATA: Patient presents for bilateral diagnostic
examination due to intermittent diffuse inferior and lateral left
mammogram.

EXAM:
DIGITAL DIAGNOSTIC BILATERAL MAMMOGRAM WITH CAD AND TOMO

[R CC synth-2D]
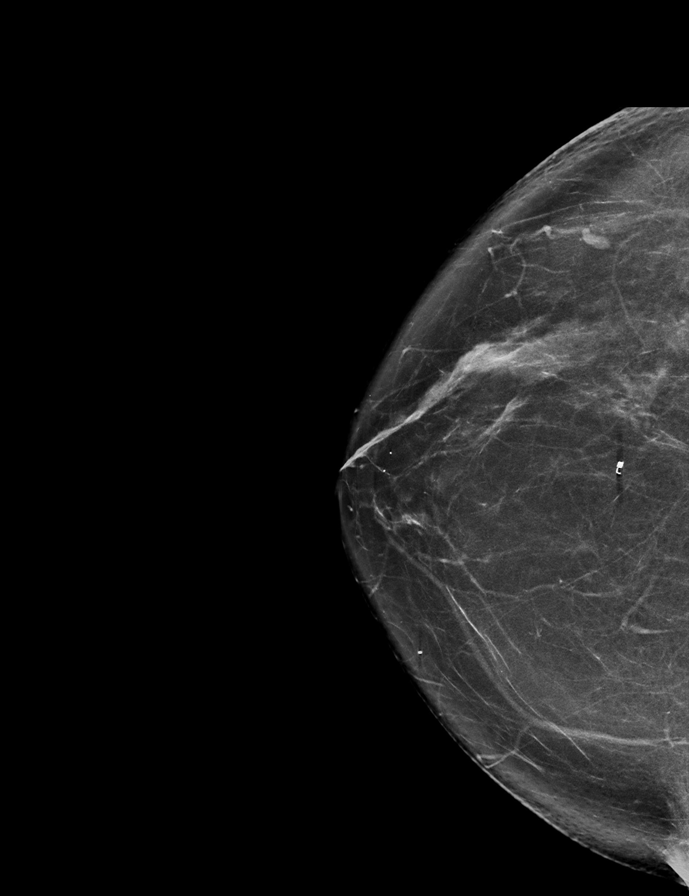

[L CC synth-2D]
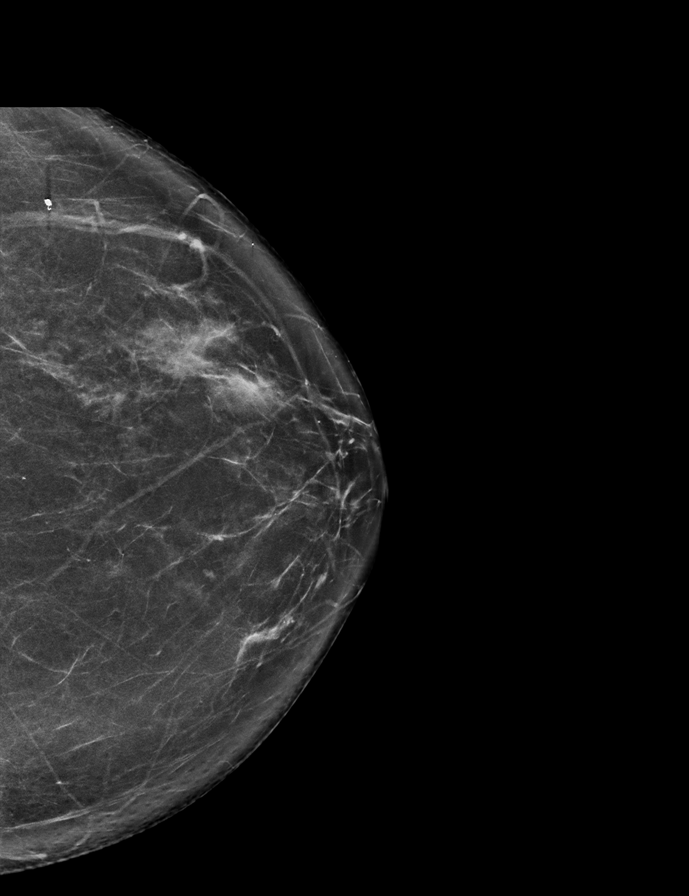

[R MLO synth-2D]
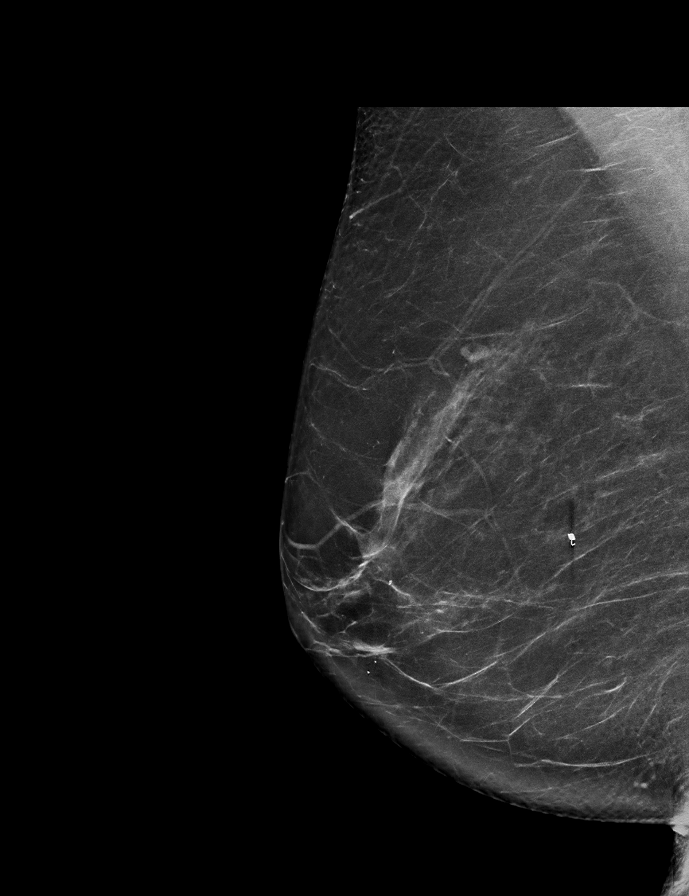

[L MLO synth-2D]
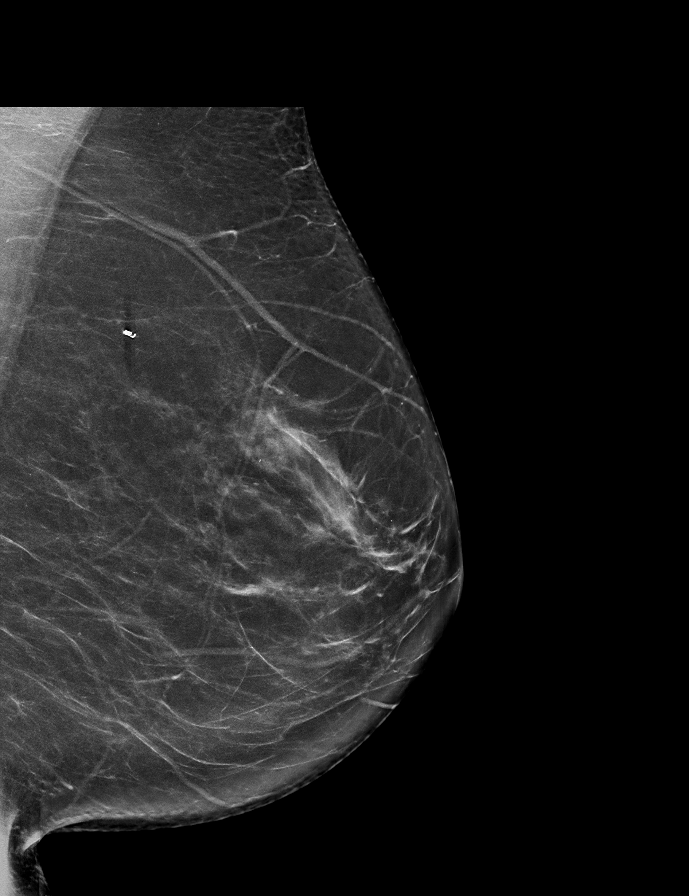

[R MLO tomo · 2 of 79 frames shown]
[frame 26/79]
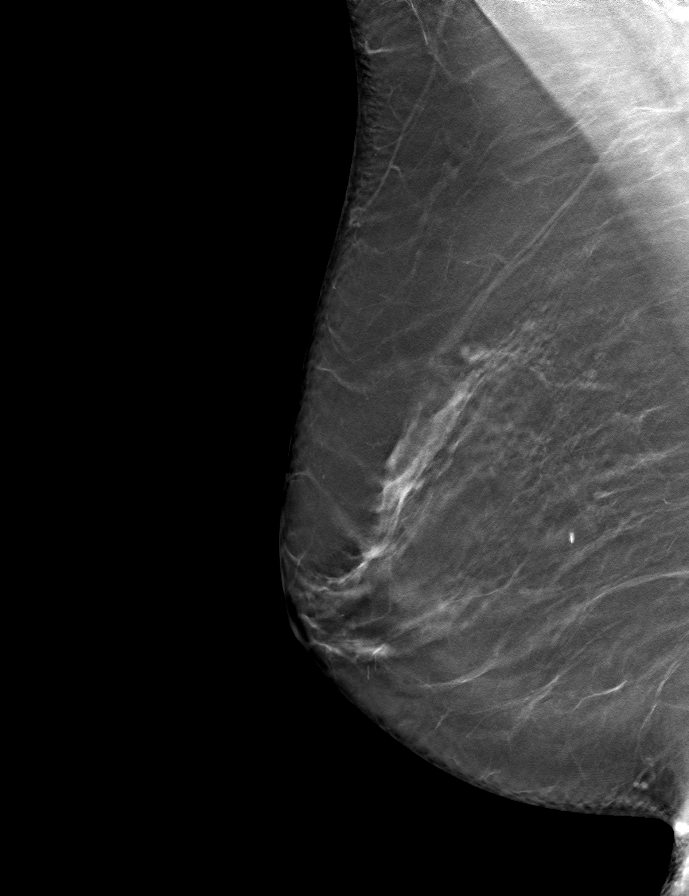
[frame 40/79]
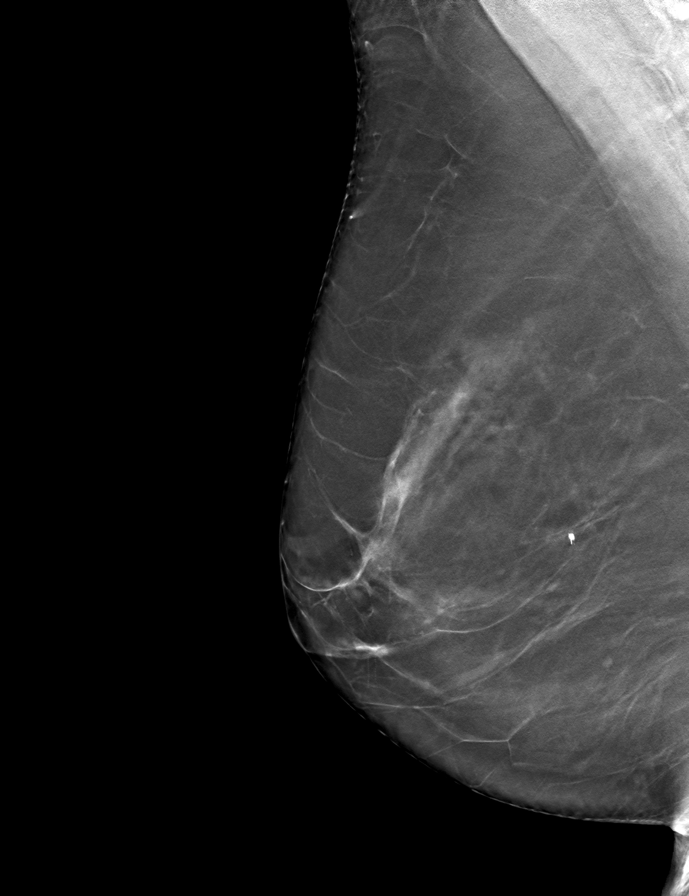

[R CC tomo · tomo slice 39/76.0]
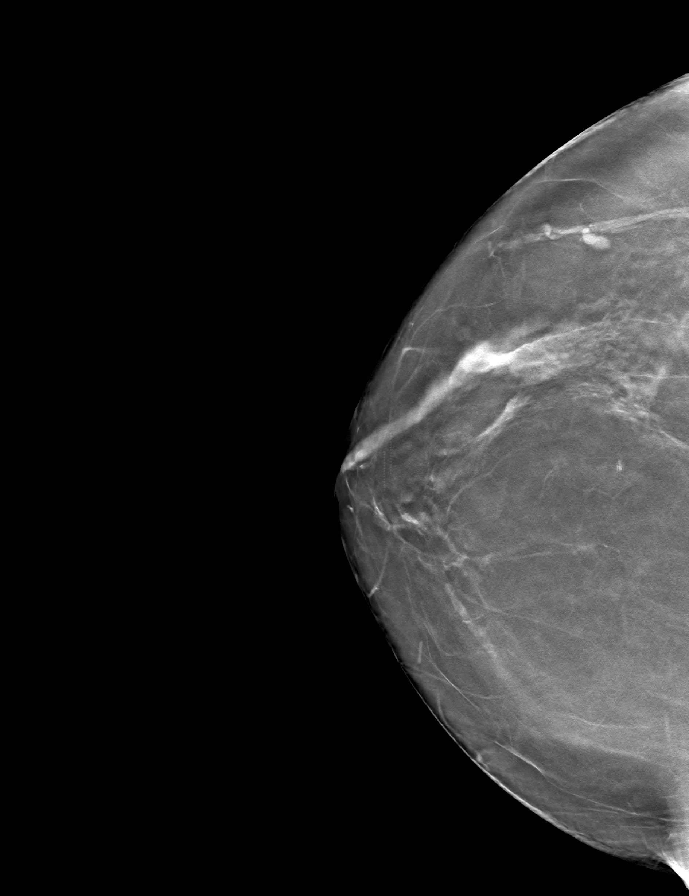

[L CC tomo · tomo slice 37/73.0]
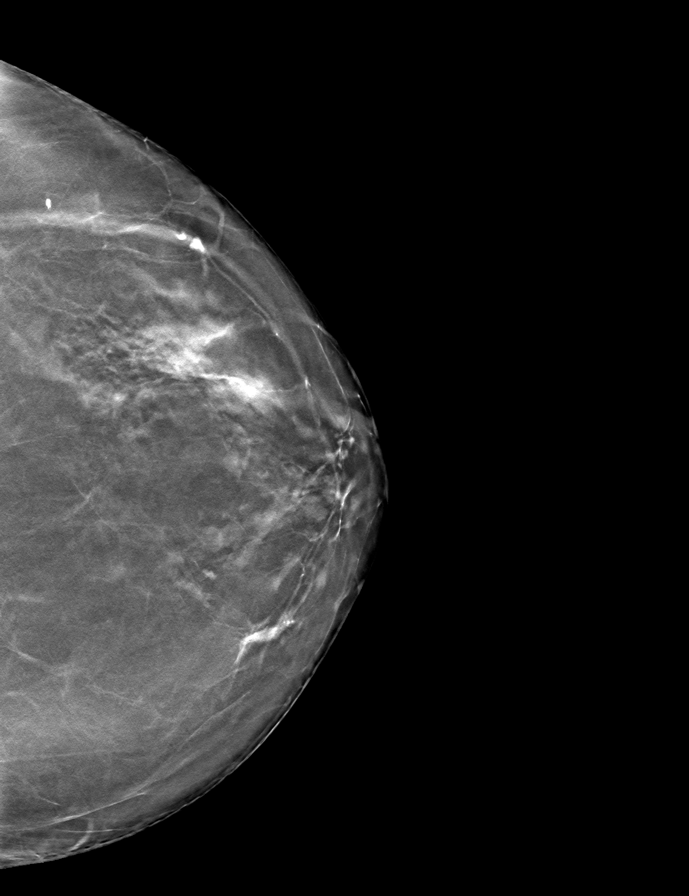

[L MLO tomo · tomo slice 39/78.0]
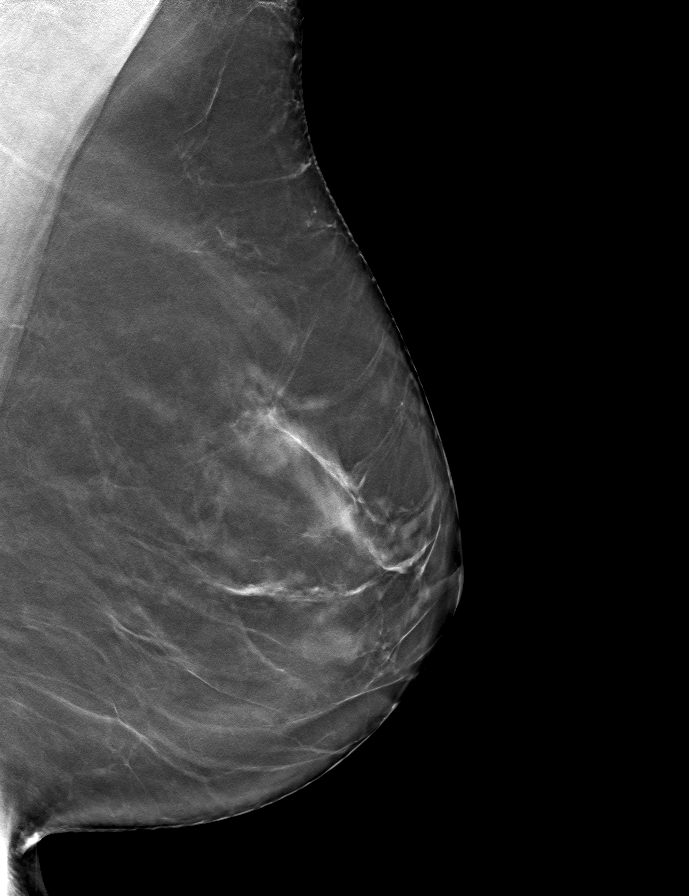

[9 of 24 positions shown; findings below may reference images not displayed]

ACR Breast Density Category b: There are scattered areas of
fibroglandular density.
FINDINGS: Examination demonstrates no focal abnormality within the left breast
to account for patient's pain. Left breast is otherwise unchanged.
Right breast is unchanged.

Mammographic images were processed with CAD.
IMPRESSION: No focal abnormality within the left breast to account for patient's
diffuse pain.

RECOMMENDATION:
Recommend continued management of patient's left breast pain on a
clinical basis. Otherwise, recommend continued annual bilateral
screening mammographic follow-up.

I have discussed the findings and recommendations with the patient.
If applicable, a reminder letter will be sent to the patient
regarding the next appointment.

BI-RADS CATEGORY  1: Negative.

## 2022-02-26 NOTE — Progress Notes (Signed)
62 y.o. G74P1021 Married White or Caucasian Not Hispanic or Latino female here for annual exam.     She c/o a 1 year h/o an intermittent awareness/discomfort in the RLQ. Lasts a couple of minutes. She has a normal BM qd. No urinary c/o. No vaginal bleeding.  Sexually active, no pain.   She just started lexapro for anxiety.   Patient's last menstrual period was 03/07/2012.          Sexually active: Yes.    The current method of family planning is post menopausal status.    Exercising: Yes.     Walking  Smoker:  no  Health Maintenance: Pap:  01/05/21 WNL Hr HPV Neg; 02-15-16 Normal Neg HR HPV  History of abnormal Pap:  no MMG:  05/02/21 Bi-rads 2 benign (care everywhere)  BMD:   never  Colonoscopy: 2021 f/u 2031  TDaP:  2019  Gardasil: n/a   reports that she has never smoked. She has never used smokeless tobacco. She reports current alcohol use of about 7.0 standard drinks of alcohol per week. She reports that she does not use drugs. Retired. Her daughters and 2 granddaughters (38 and 27) live next door.   Past Medical History:  Diagnosis Date   Acid reflux    Elevated cholesterol     Past Surgical History:  Procedure Laterality Date   BREAST BIOPSY Bilateral 10/2016   CESAREAN SECTION      Current Outpatient Medications  Medication Sig Dispense Refill   escitalopram (LEXAPRO) 5 MG tablet Take 5 mg by mouth daily.     famotidine (PEPCID) 40 MG tablet SMARTSIG:1 Tablet(s) By Mouth Every Evening     finasteride (PROPECIA) 1 MG tablet Take 1 mg by mouth daily.     melatonin 5 MG TABS Take by mouth.     Multiple Vitamin (MULTIVITAMIN) tablet Take 1 tablet by mouth daily.     Probiotic Product (PROBIOTIC PO) Take by mouth.     rosuvastatin (CRESTOR) 10 MG tablet Take by mouth.     No current facility-administered medications for this visit.    Family History  Problem Relation Age of Onset   Stroke Mother        mini   Hypertension Mother    Other Mother        Paige Mann    Hypertension Father    Melanoma Father    Diabetes Brother    Breast cancer Maternal Aunt    Cancer Paternal Grandfather        kidney tumor    Review of Systems  All other systems reviewed and are negative.   Exam:   BP 110/70   Pulse 75   Ht 5' 5.5" (1.664 m)   Wt 152 lb (68.9 kg)   LMP 03/07/2012   SpO2 100%   BMI 24.91 kg/m   Weight change: @WEIGHTCHANGE @ Height:   Height: 5' 5.5" (166.4 cm)  Ht Readings from Last 3 Encounters:  03/05/22 5' 5.5" (1.664 m)  01/05/21 5\' 6"  (1.676 m)  06/30/19 5' 5.5" (1.664 m)    General appearance: alert, cooperative and appears stated age Head: Normocephalic, without obvious abnormality, atraumatic Neck: no adenopathy, supple, symmetrical, trachea midline and thyroid normal to inspection and palpation Lungs: clear to auscultation bilaterally Cardiovascular: regular rate and rhythm Breasts: normal appearance, no masses or tenderness Abdomen: soft, non-tender; non distended,  no masses,  no organomegaly Extremities: extremities normal, atraumatic, no cyanosis or edema Skin: Skin color, texture, turgor normal. No  rashes or lesions Lymph nodes: Cervical, supraclavicular, and axillary nodes normal. No abnormal inguinal nodes palpated Neurologic: Grossly normal   Pelvic: External genitalia:  no lesions              Urethra:  normal appearing urethra with no masses, tenderness or lesions              Bartholins and Skenes: normal                 Vagina: normal appearing vagina with normal color and discharge, no lesions              Cervix: no lesions               Bimanual Exam:  Uterus:  normal size, contour, position, consistency, mobility, non-tender              Adnexa: no mass, fullness, tenderness               Rectovaginal: Confirms               Anus:  normal sphincter tone, no lesions  Bladder: not tender  Pelvic floor: not tender  Gae Dry, CMA chaperoned for the exam.  1. Well woman  exam Discussed breast self exam Discussed calcium and vit D intake No pap this year Mammogram in 3/24 Labs with primary Colonoscopy is UTD  2. Pain, abdominal, RLQ Normal exam Recommended she calendar her pain and BM's, she will f/u with her primary We discussed the option of a pelvic ultrasound if her primary doesn't feel it is GI

## 2022-03-05 ENCOUNTER — Encounter: Payer: Self-pay | Admitting: Obstetrics and Gynecology

## 2022-03-05 ENCOUNTER — Ambulatory Visit (INDEPENDENT_AMBULATORY_CARE_PROVIDER_SITE_OTHER): Payer: BC Managed Care – PPO | Admitting: Obstetrics and Gynecology

## 2022-03-05 VITALS — BP 110/70 | HR 75 | Ht 65.5 in | Wt 152.0 lb

## 2022-03-05 DIAGNOSIS — Z01419 Encounter for gynecological examination (general) (routine) without abnormal findings: Secondary | ICD-10-CM | POA: Diagnosis not present

## 2022-03-05 DIAGNOSIS — R1031 Right lower quadrant pain: Secondary | ICD-10-CM

## 2023-06-12 ENCOUNTER — Telehealth: Payer: Self-pay | Admitting: Family Medicine

## 2023-06-12 NOTE — Telephone Encounter (Deleted)
 Copied from CRM [id number]. Topic: General - Other >> Jun 11, 2023  3:22 PM Chuck Crater wrote: Reason for CRM: Alaina Aline [facility] is calling in regards to a lab order that was sent to [facility]. She said the specific lab of question was from 02/07/2023 by Felicita Horns. She stated that the lab in question is the ccp Antibody which is diagnosis code specific and insurance won't cover it because only one diagnosis code was submitted to [facility] so she needs a coding review of that one so it can be billed correctly.
